# Patient Record
Sex: Female | Born: 1965 | Race: White | Hispanic: No | Smoking: Never smoker
Health system: Southern US, Community
[De-identification: ages and names within clinical notes are randomized; demographics above are authoritative.]

## PROBLEM LIST (undated history)

## (undated) DIAGNOSIS — K8020 Calculus of gallbladder without cholecystitis without obstruction: Secondary | ICD-10-CM

## (undated) DIAGNOSIS — M1990 Unspecified osteoarthritis, unspecified site: Secondary | ICD-10-CM

## (undated) DIAGNOSIS — I493 Ventricular premature depolarization: Secondary | ICD-10-CM

## (undated) DIAGNOSIS — O009 Unspecified ectopic pregnancy without intrauterine pregnancy: Secondary | ICD-10-CM

## (undated) DIAGNOSIS — G7000 Myasthenia gravis without (acute) exacerbation: Secondary | ICD-10-CM

## (undated) HISTORY — PX: OOPHORECTOMY: SHX86

## (undated) HISTORY — PX: TUBAL LIGATION: SHX77

---

## 2000-03-04 ENCOUNTER — Encounter: Payer: Self-pay | Admitting: Emergency Medicine

## 2000-03-04 ENCOUNTER — Emergency Department (HOSPITAL_COMMUNITY): Admission: EM | Admit: 2000-03-04 | Discharge: 2000-03-04 | Payer: Self-pay | Admitting: Emergency Medicine

## 2000-03-10 ENCOUNTER — Encounter: Admission: RE | Admit: 2000-03-10 | Discharge: 2000-05-20 | Payer: Self-pay | Admitting: Internal Medicine

## 2000-04-28 ENCOUNTER — Other Ambulatory Visit: Admission: RE | Admit: 2000-04-28 | Discharge: 2000-04-28 | Payer: Self-pay | Admitting: Internal Medicine

## 2001-04-19 ENCOUNTER — Encounter (INDEPENDENT_AMBULATORY_CARE_PROVIDER_SITE_OTHER): Payer: Self-pay

## 2001-04-19 ENCOUNTER — Ambulatory Visit (HOSPITAL_COMMUNITY): Admission: RE | Admit: 2001-04-19 | Discharge: 2001-04-19 | Payer: Self-pay

## 2001-08-15 ENCOUNTER — Other Ambulatory Visit: Admission: RE | Admit: 2001-08-15 | Discharge: 2001-08-15 | Payer: Self-pay | Admitting: Internal Medicine

## 2004-10-03 ENCOUNTER — Emergency Department (HOSPITAL_COMMUNITY): Admission: EM | Admit: 2004-10-03 | Discharge: 2004-10-03 | Payer: Self-pay | Admitting: Emergency Medicine

## 2006-03-12 ENCOUNTER — Emergency Department (HOSPITAL_COMMUNITY): Admission: EM | Admit: 2006-03-12 | Discharge: 2006-03-12 | Payer: Self-pay | Admitting: Emergency Medicine

## 2006-06-30 ENCOUNTER — Encounter: Payer: Self-pay | Admitting: Obstetrics and Gynecology

## 2006-06-30 ENCOUNTER — Ambulatory Visit: Payer: Self-pay | Admitting: Obstetrics and Gynecology

## 2006-06-30 ENCOUNTER — Observation Stay (HOSPITAL_COMMUNITY): Admission: AD | Admit: 2006-06-30 | Discharge: 2006-07-01 | Payer: Self-pay | Admitting: Obstetrics and Gynecology

## 2006-07-15 ENCOUNTER — Ambulatory Visit: Payer: Self-pay | Admitting: Obstetrics and Gynecology

## 2007-08-29 ENCOUNTER — Observation Stay (HOSPITAL_COMMUNITY): Admission: EM | Admit: 2007-08-29 | Discharge: 2007-08-30 | Payer: Self-pay | Admitting: Emergency Medicine

## 2007-08-29 ENCOUNTER — Ambulatory Visit: Payer: Self-pay | Admitting: Cardiology

## 2010-06-10 NOTE — Discharge Summary (Signed)
NAMECLEMIE, GENERAL              ACCOUNT NO.:  [id number]   MEDICAL RECORD NO.:  [id number]          PATIENT TYPE:  OBV   LOCATION:  6531                         FACILITY:  [facility]   PHYSICIAN:  Eduard Clos, MDDATE OF BIRTH:  11/18/1965   DATE OF ADMISSION:  08/29/2007  DATE OF DISCHARGE:  08/30/2007                               DISCHARGE SUMMARY   COURSE IN THE HOSPITAL:  A 45 year old female with no significant past  medical history, presented with chest pain.  The patient was admitted to  telemetry floor.  Serial cardiac enzymes and EKG were done which were  within acceptable limits.  The patient had a stress test done.  As per  laboratory of cardiology, the patient's stress test was low risk.  The  patient's chest x-ray also did not show an acute finding at this time.  The patient's cardiac enzymes were negative.  The patient is  asymptomatic and with low-risk stress test, the patient was discharged  home.   FINAL DIAGNOSIS:  Atypical chest pain.   MEDICATION ON DISCHARGE:  Multivitamins, taken previously.   PLAN:  The patient advised to follow up with her primary care physician  within a week's time.      Eduard Clos, MD  Electronically Signed     ANK/MEDQ  D:  08/30/2007  T:  08/31/2007  Job:  [id number]

## 2010-06-10 NOTE — Op Note (Signed)
Kelli Stephens, Kelli Stephens                 ACCOUNT NO.:  [id number]   MEDICAL RECORD NO.:  [id number]          PATIENT TYPE:  OBV   LOCATION:  9107                          FACILITY:  [facility]   PHYSICIAN:  Phil D. Okey Dupre, M.D.     DATE OF BIRTH:  01/23/1966   DATE OF PROCEDURE:  06/30/2006  DATE OF DISCHARGE:                               OPERATIVE REPORT   PREOPERATIVE DIAGNOSES:  1. Ruptured ectopic pregnancy.  2. Voluntary sterilization.   POSTOPERATIVE DIAGNOSES:  1. Ruptured right tubal pregnancy with pelvic adhesions.  2. Voluntary sterilization.   PROCEDURES:  1. Exploratory laparotomy.  2. Right salpingo-oophorectomy.  3. Clip occlusion of left fallopian tube.   SURGEON:  Javier Glazier. Okey Dupre, MD.   FIRST ASSISTANT:  Paticia Stack, MD.   ESTIMATED BLOOD LOSS:  400 ml.   POSTOPERATIVE CONDITION:  Satisfactory.   ANESTHESIA:  General.   REASON FOR SURGERY:  The patient, a 45 year old, multiparous, white  female, with a last menstrual period not a normal one sometime in mid  April, had an obvious ruptured ectopic pregnancy on ultrasound and was  taken immediately to the operating room for mini laparotomy.  During  discussion with the patient, she indicated desire to have the other tube  occluded as she wanted no more pregnancies at the age of 36.  We had her  sign the proper papers and agreed to do this.  She was taken to the  operating room and the procedure is as follows:   Under satisfactory general anesthesia with the patient in a dorsal semi-  lithotomy position, the perineum, vagina, and abdomen were prepped and  draped in the usual sterile manner.  Bimanual pelvic examination  revealed a very small anterior uterus, freely movable, with a right  adnexal mass.  A Graves speculum was placed in the cervix, and the  anterior lip of the cervix grasped with a single tooth tenaculum, and a  tenaculum with a sound was inserted into the uterus for mobilization of  the uterus and  easier handling during the procedure.  The abdomen was  then entered through a Pfannenstiel incision situated 1.5 cm above the  symphysis pubis and extending for total length of 8 cm.  The abdomen was  entered by layers, and bleeders were controlled with high cautery on  entry.  On entering the peritoneal cavity, there was a large amount of  blood and clots in the cavity, which were aspirated.  The uterus was  pushed up into the operative field, and the right fallopian tube  identified, followed down and found to be completely normal, and a  Filshie clip was used to occlude the tube.  The other tube was bound  down with adhesions in the cul-de-sac, and there was a 3 cm mass  palpable there, which when brought up ruptured during motion and was the  obvious ectopic.  It was adherent to the area of the cornua and with the  ovary, and it was decided that we would remove that ovary with the tube  as it would be difficult to  dissect that away without interfering with  the circulation to the ovary.  Kelly clamps were placed through the meso  beneath the ovary, and the ovary infundibulopelvic ligament was clamped.  The remaining tissue, including the cornual area of the tube was clamped  with a second clamp, and the tissue above the clamps excised so that the  tube, ovary, and ectopic pregnancy were removed in toto.  Free ties plus  a suture ligature was used on each of the clamped pedicles, and the area  was observed for any bleeding; none was noted.  The cul-de-sac was  examined for any sign of bleeding where the adhesions were; none was  noted.  The incision was closed by fascial closure with a #0 Vicryl  suture on an atraumatic needle.  Subcuticular sutures of #3-0 Vicryl  were used for subcuticular closure.  Dermoplasty was used for skin edge  closure.  Tape, instrument, sponge, and needle count reported correct at  this time.  A dry sterile dressing was applied.  The Foley catheter was   draining clear amber urine at the end of the procedure.  The tenaculums  were removed from the vagina, and the patient transferred to the  recovery room in satisfactory condition, having tolerated the procedure  well.      Phil D. Okey Dupre, M.D.  Electronically Signed     PDR/MEDQ  D:  06/30/2006  T:  06/30/2006  Job:  [id number]

## 2010-06-10 NOTE — H&P (Signed)
NAME:  Kelli Stephens, Kelli Stephens NO.:  [id number]   MEDICAL RECORD NO.:  [id number]          PATIENT TYPE:  EMS   LOCATION:  MAJO                         FACILITY:  [facility]   PHYSICIAN:  Vania Rea, M.D. DATE OF BIRTH:  10/30/1965   DATE OF ADMISSION:  08/29/2007  DATE OF DISCHARGE:                              HISTORY & PHYSICAL   PRIMARY CARE PHYSICIAN:  Unassigned.   CHIEF COMPLAINT:  Chest pain since this afternoon.   HISTORY OF PRESENT ILLNESS:  This is a 45 year old Caucasian lady with  no significant past medical history, but who does have strong family  history of hypertension and diabetes, but has been feeling vaguely ill  for about a week now.  She started having moderately severe left upper  chest pressure radiating through to her left scapula since earlier this  afternoon.  The patient presented to the emergency room about an hour  after the symptoms started and received sublingual nitroglycerin which  did bring some relief.  The patient says the chest pressure was  associated with nausea and diaphoresis and lightheadedness, but there  was no frank syncope.  The patient has not experienced similar symptoms.  She did have an episode some years ago of left-sided numbness, tunnel  vision, and syncopal type event associated with slurred speech and was  told it resolved by the time she got to the emergency room and she was  told it was a TIA.   The patient denies shortness of breath, PND, or lower extremity edema.  She denies dyspnea on exertion.   PAST MEDICAL HISTORY:  Significant for fibroid uterus, peripartum  hypertension, miscarriages, and a premature twin pregnancy 8 years ago.   MEDICATIONS:  Multivitamin.   ALLERGIES:  No known drug allergies.   SOCIAL HISTORY:  Denies tobacco, alcohol, or illicit drug use.  She does  clerical work for counseling.   FAMILY HISTORY:  Significant for a father who was diabetic and  hypertensive and a mother who  was hypertensive.  She has a sister with  Crohn's and some type of cardiac disease.  She says she has a brother  with down syndrome.  Both her mother and her maternal grandmother had  cancer of the uterus.   REVIEW OF SYSTEMS:  Other than noted above, significant only for  irregular menstruation.  She denies any history of anxiety or any type  of psychiatric history.  The patient has irregular menstruation.  Her  last menstrual period started about 3 days ago and it is currently  ending and this is typical for her.   PHYSICAL EXAMINATION:  GENERAL:  Pleasant, young, Caucasian lady  reclining on a stretcher in no acute distress.  VITAL SIGNS:  Temperature is 98.2, pulse 66, respirations 18, blood  pressure 105/61, and she is saturating at 100% on room air.  Her pain  level is 7/10 on admission and it is currently 0/10.  However, she does  continue to complain of some chest pressure and discomfort.  HEENT:  Pupils are round, equal, and reactive.  Mucous membranes are  pink and anicteric.  She  has no cervical lymphadenopathy, no  thyromegaly, and no carotid bruit.  Her teeth are in good condition.  She has no caries.  CHEST:  Clear to auscultation bilaterally.  Her chest pressure is not  reproducible.  CARDIOVASCULAR:  Regular rhythm without murmur.  ABDOMEN:  Scaphoid, soft, and nontender.  EXTREMITIES:  Without edema.  She has 2+ pulses bilaterally.  NEUROLOGY:  Cranial nerves II-XII grossly intact.  She has no focal  neurologic deficit.  She does describe mild tenderness over the lower  cervical and upper thoracic vertebrae, but not along either trapezius  muscle.  She has no tenderness of the posterior chest wall.   LABORATORY DATA:  CBC is completely unremarkable.  Her serum chemistry  is likewise unremarkable.  Her cardiac enzymes have been completely  normal with a myoglobin of 37.8, undetectable CK-MB and troponin.   ASSESSMENT:  A 45 year old Caucasian lady who presents with  chest  pressure associated with diaphoresis relieved by nitroglycerin, but with  negative cardiac enzymes and abnormal EKG.   PLAN:  We will bring this lady in on a chest pain rule out and hopefully  in the morning she can get a cardiac stress test.  If this is negative,  she can be followed up by her primary care physician.      Vania Rea, M.D.  Electronically Signed     LC/MEDQ  D:  08/29/2007  T:  08/30/2007  Job:  [id number]

## 2010-06-13 NOTE — Op Note (Signed)
[facility]  Patient:    Kelli Stephens, Kelli Stephens Visit Number: [id number] MRN: [id number]          Service Type: DSU Location: [facility] Attending Physician:  Barbaraann Cao Dictated by:   Ronda Fairly. Galen Daft, M.D. Proc. Date: 04/19/01 Admit Date:  04/19/2001   CC:         Crista Luria, M.D.   Operative Report  PREOPERATIVE DIAGNOSIS:       Missed abortion.  POSTOPERATIVE DIAGNOSIS:      Missed abortion.  OPERATION:                    Suction dilatation and evacuation.  SURGEON:                      Ronda Fairly. Galen Daft, M.D.  ANESTHESIA:                   IV sedation and MAC.  COMPLICATIONS:                None.  ESTIMATED BLOOD LOSS:         Minimal.  SPECIMEN:                     Uterine contents.  DESCRIPTION OF PROCEDURE:     The patient was identified as Kelli Stephens. Informed consent had been obtained regarding the risks of the procedure, the risks of incomplete procedure, and the requirement of a secondary procedure, as well as the risks of this situation with infection and bleeding.  The postoperative instructions were given to the patient prior to the anesthesia.  The patient had signed the consent, and we brought her back to the operating room.  The patient had an uterus eight to ten weeks size and mobile.  The adnexal areas were nontender and not enlarged.  The cervix was infiltrated with 1% lidocaine, a total of 8 cc for cervical block.  The cervix was dilated to accept a 10 mm suction curet and this was placed into the fundus, and suction was applied, removing products of conception.  It was complete.  The sharp curet was utilized for diagnostic purposes at the end, and there was a sharp uterine cry at all quadrants, suggestive of complete procedure. However, there is some retained tissue.  The patient was not bleeding actively at the end of the procedure.  She tolerated it well and left the operating room in stable condition. Dictated by:    Ronda Fairly. Galen Daft, M.D. Attending Physician:  Barbaraann Cao DD:  04/19/01 TD:  04/20/01 Job: [id number]

## 2010-07-26 ENCOUNTER — Emergency Department (HOSPITAL_COMMUNITY)
Admission: EM | Admit: 2010-07-26 | Discharge: 2010-07-26 | Disposition: A | Discharge status: Home / Self Care | Payer: 59 | Attending: Emergency Medicine | Admitting: Emergency Medicine

## 2010-07-26 ENCOUNTER — Emergency Department (HOSPITAL_COMMUNITY): Payer: 59

## 2010-07-26 DIAGNOSIS — R109 Unspecified abdominal pain: Secondary | ICD-10-CM | POA: Insufficient documentation

## 2010-07-26 DIAGNOSIS — R112 Nausea with vomiting, unspecified: Secondary | ICD-10-CM | POA: Insufficient documentation

## 2010-07-26 DIAGNOSIS — K8020 Calculus of gallbladder without cholecystitis without obstruction: Secondary | ICD-10-CM | POA: Insufficient documentation

## 2010-07-26 DIAGNOSIS — R630 Anorexia: Secondary | ICD-10-CM | POA: Insufficient documentation

## 2010-07-26 DIAGNOSIS — K5900 Constipation, unspecified: Secondary | ICD-10-CM | POA: Insufficient documentation

## 2010-07-26 LAB — COMPREHENSIVE METABOLIC PANEL
ALT: 18 U/L (ref 0–35)
AST: 14 U/L (ref 0–37)
Alkaline Phosphatase: 70 U/L (ref 39–117)
CO2: 28 mEq/L (ref 19–32)
Calcium: 9.1 mg/dL (ref 8.4–10.5)
GFR calc Af Amer: >60 mL/min (ref >60)
GFR calc non Af Amer: >60 mL/min (ref >60)
Glucose, Bld: 104 mg/dL — ABNORMAL HIGH (ref 70–99)
Potassium: 3.6 mEq/L (ref 3.5–5.1)
Sodium: 137 mEq/L (ref 135–145)

## 2010-07-26 LAB — URINE MICROSCOPIC-ADD ON

## 2010-07-26 LAB — DIFFERENTIAL
Basophils Absolute: 0 10*3/uL (ref 0.0–0.1)
Basophils Relative: 0 % (ref 0–1)
Eosinophils Absolute: 0.2 10*3/uL (ref 0.0–0.7)
Monocytes Relative: 8 % (ref 3–12)
Neutro Abs: 5 10*3/uL (ref 1.7–7.7)
Neutrophils Relative %: 61 % (ref 43–77)

## 2010-07-26 LAB — CBC
Hemoglobin: 13.3 g/dL (ref 12.0–15.0)
Platelets: 242 10*3/uL (ref 150–400)
RBC: 4.09 MIL/uL (ref 3.87–5.11)
WBC: 8.2 10*3/uL (ref 4.0–10.5)

## 2010-07-26 LAB — URINALYSIS, ROUTINE W REFLEX MICROSCOPIC
Bilirubin Urine: NEGATIVE
Glucose, UA: NEGATIVE mg/dL
Specific Gravity, Urine: 1.022 (ref 1.005–1.030)
Urobilinogen, UA: 0.2 mg/dL (ref 0.0–1.0)

## 2010-07-29 ENCOUNTER — Encounter (INDEPENDENT_AMBULATORY_CARE_PROVIDER_SITE_OTHER): Payer: Self-pay | Admitting: General Surgery

## 2010-10-01 ENCOUNTER — Other Ambulatory Visit: Payer: Self-pay | Admitting: Family Medicine

## 2010-10-01 DIAGNOSIS — R51 Headache: Secondary | ICD-10-CM

## 2010-10-03 ENCOUNTER — Emergency Department (HOSPITAL_COMMUNITY): Payer: 59

## 2010-10-03 ENCOUNTER — Emergency Department (HOSPITAL_COMMUNITY)
Admission: EM | Admit: 2010-10-03 | Discharge: 2010-10-03 | Disposition: A | Discharge status: Home / Self Care | Payer: 59 | Attending: Emergency Medicine | Admitting: Emergency Medicine

## 2010-10-03 DIAGNOSIS — R51 Headache: Secondary | ICD-10-CM | POA: Insufficient documentation

## 2010-10-03 DIAGNOSIS — R29810 Facial weakness: Secondary | ICD-10-CM | POA: Insufficient documentation

## 2010-10-03 DIAGNOSIS — H02409 Unspecified ptosis of unspecified eyelid: Secondary | ICD-10-CM | POA: Insufficient documentation

## 2010-10-03 DIAGNOSIS — H538 Other visual disturbances: Secondary | ICD-10-CM | POA: Insufficient documentation

## 2010-10-03 LAB — DIFFERENTIAL
Basophils Absolute: 0 10*3/uL (ref 0.0–0.1)
Basophils Relative: 0 % (ref 0–1)
Eosinophils Relative: 1 % (ref 0–5)
Lymphocytes Relative: 31 % (ref 12–46)
Monocytes Absolute: 0.6 10*3/uL (ref 0.1–1.0)
Monocytes Relative: 8 % (ref 3–12)
Neutro Abs: 4.1 10*3/uL (ref 1.7–7.7)

## 2010-10-03 LAB — CBC
HCT: 40 % (ref 36.0–46.0)
Hemoglobin: 13.6 g/dL (ref 12.0–15.0)
MCH: 31.4 pg (ref 26.0–34.0)
MCHC: 34 g/dL (ref 30.0–36.0)
RDW: 12.7 % (ref 11.5–15.5)

## 2010-10-03 LAB — COMPREHENSIVE METABOLIC PANEL
Alkaline Phosphatase: 65 U/L (ref 39–117)
BUN: 11 mg/dL (ref 6–23)
Chloride: 104 mEq/L (ref 96–112)
Creatinine, Ser: 0.63 mg/dL (ref 0.50–1.10)
GFR calc Af Amer: >60 mL/min (ref >60)
GFR calc non Af Amer: >60 mL/min (ref >60)
Glucose, Bld: 82 mg/dL (ref 70–99)
Potassium: 3.9 mEq/L (ref 3.5–5.1)
Total Bilirubin: 0.5 mg/dL (ref 0.3–1.2)

## 2010-10-03 LAB — CK TOTAL AND CKMB (NOT AT ARMC)
CK, MB: 1.7 ng/mL (ref 0.3–4.0)
Total CK: 65 U/L (ref 7–177)

## 2010-10-03 IMAGING — CT CT HEAD W/O CM
2 series · 16 of 30 positions shown, 20 images · non-contrast
Comparison: Brain MR dated [DATE].

CLINICAL DATA: Right-sided headache.  Weakness.  Right eyelid
weakness.

CT HEAD WITHOUT CONTRAST
TECHNIQUE: Contiguous axial images were obtained from the base of
the skull through the vertex without contrast.

[Series 2: head w/o · axial · non-contrast · 0.49mm/px · z∈[+99,+229]mm · 13 of 32 slices shown, 17 images]
[im 3/32  brain]
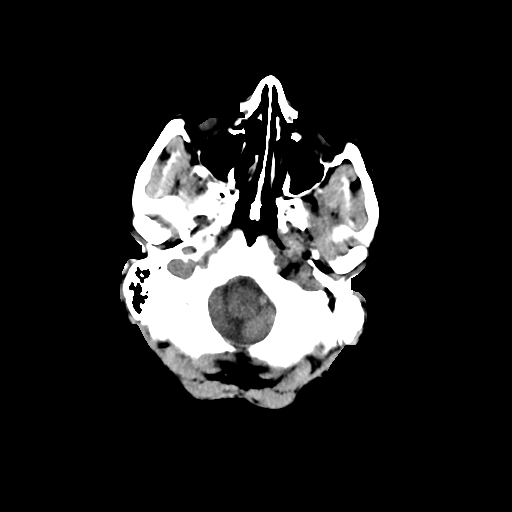
[im 3/32  bone]
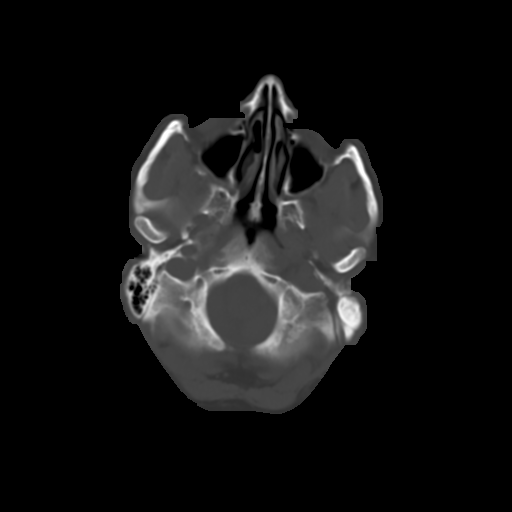
[im 5/32  brain]
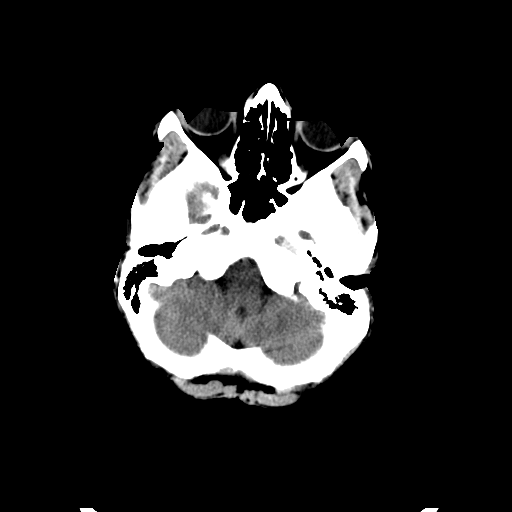
[im 7/32  brain]
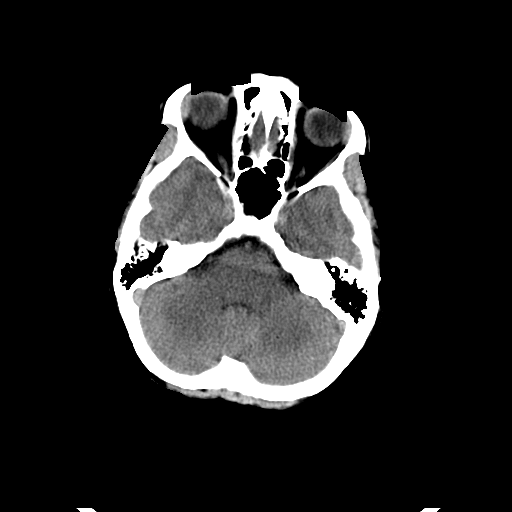
[im 9/32  brain]
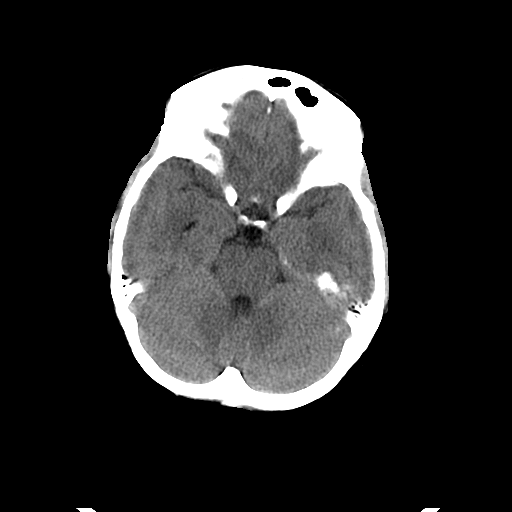
[im 12/32  brain]
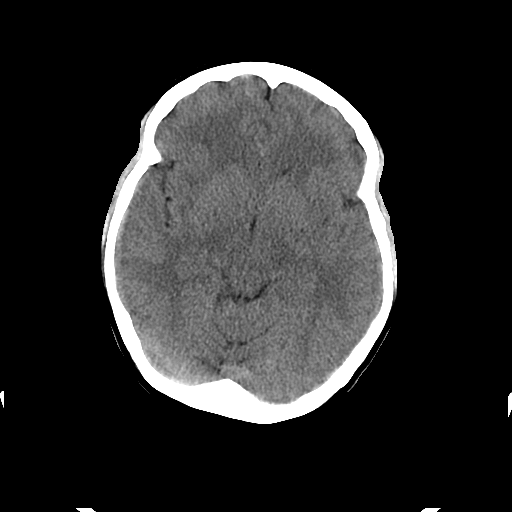
[im 12/32  bone]
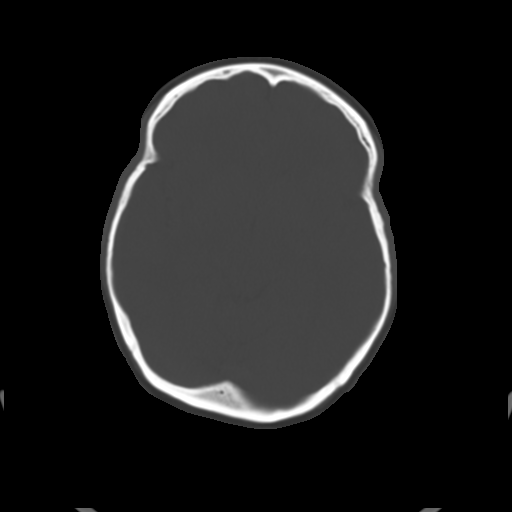
[im 14/32  brain]
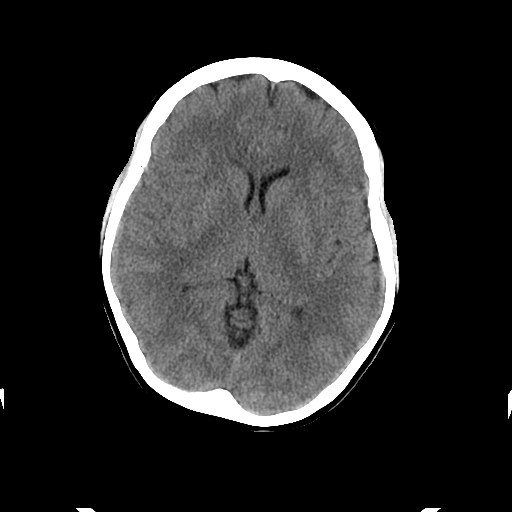
[im 16/32  brain]
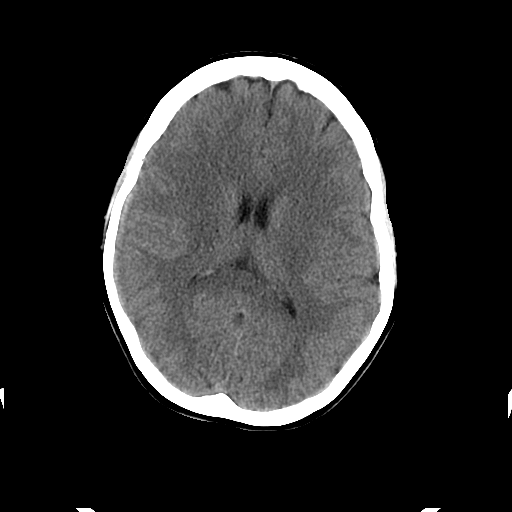
[im 18/32  brain]
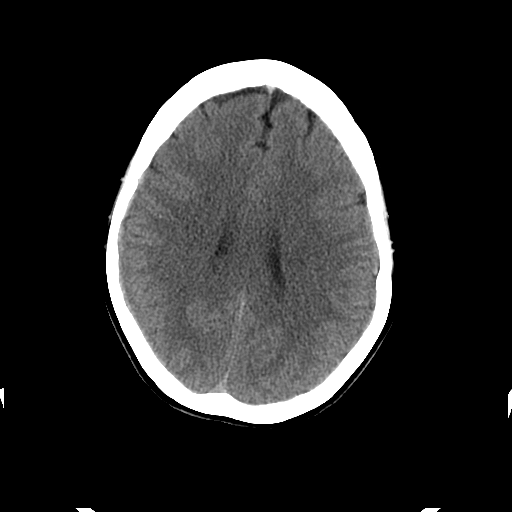
[im 20/32  brain]
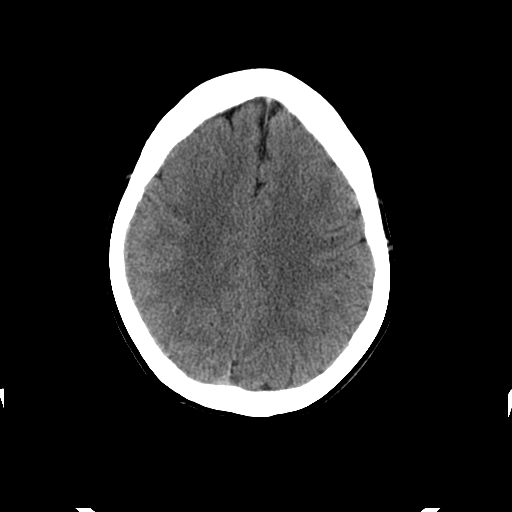
[im 20/32  bone]
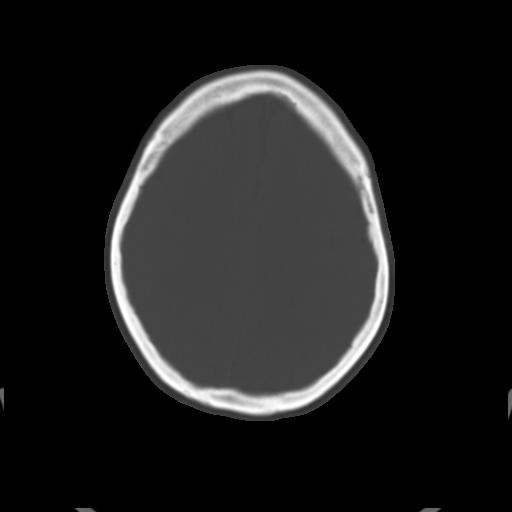
[im 23/32  brain]
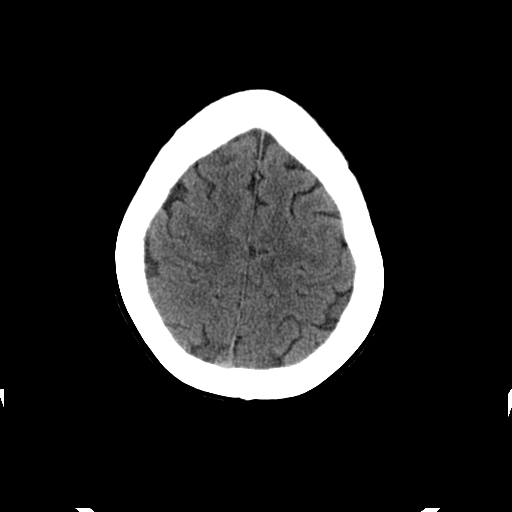
[im 25/32  brain]
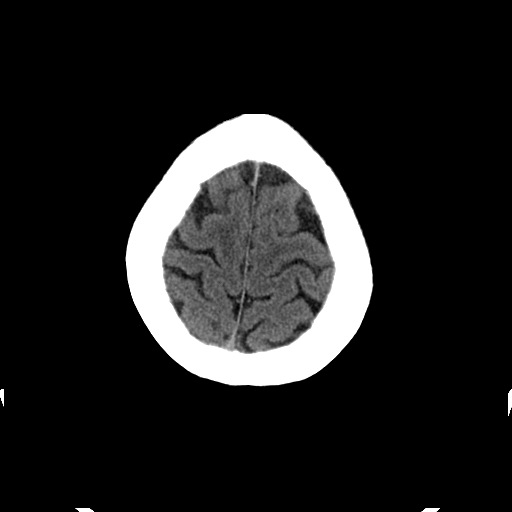
[im 27/32  brain]
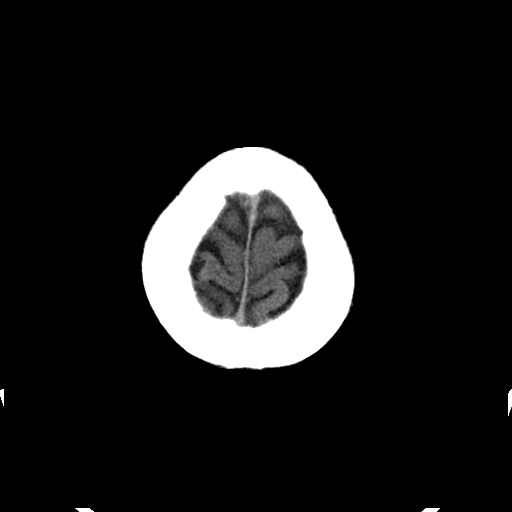
[im 29/32  brain]
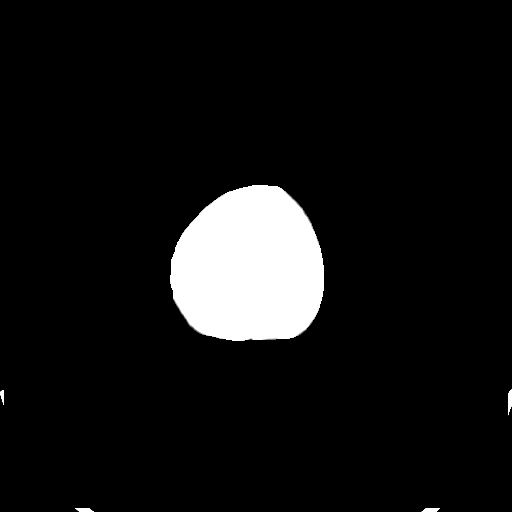
[im 29/32  bone]
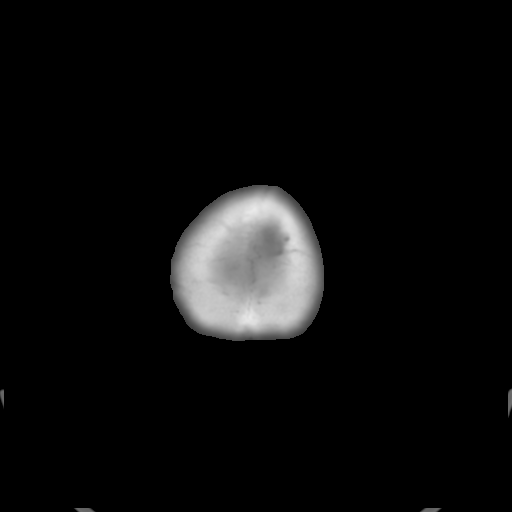

[Series 3: head w/o bone · axial · non-contrast · 0.49mm/px · z∈[+99,+144]mm · 3 of 32 slices shown]
[im 3/32  bone]
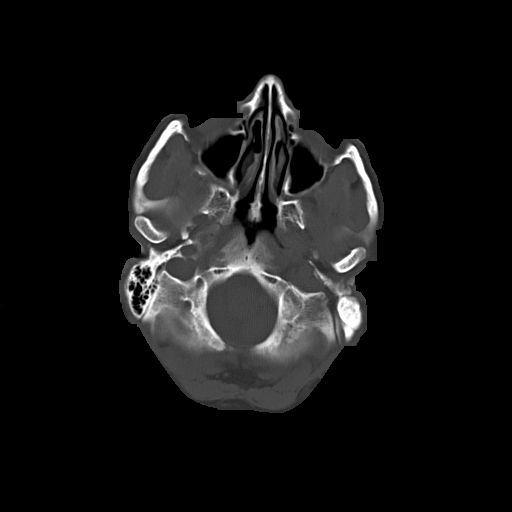
[im 7/32  bone]
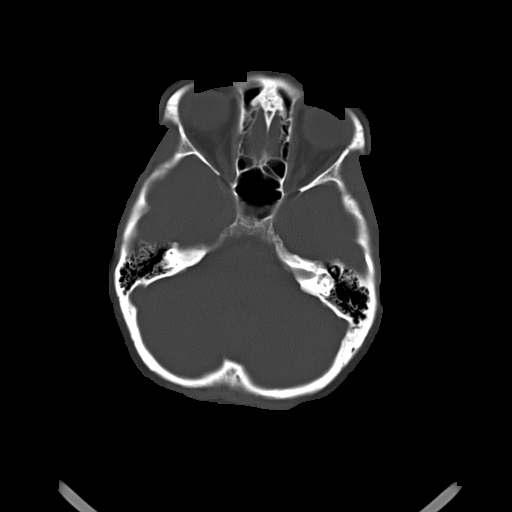
[im 12/32  bone]
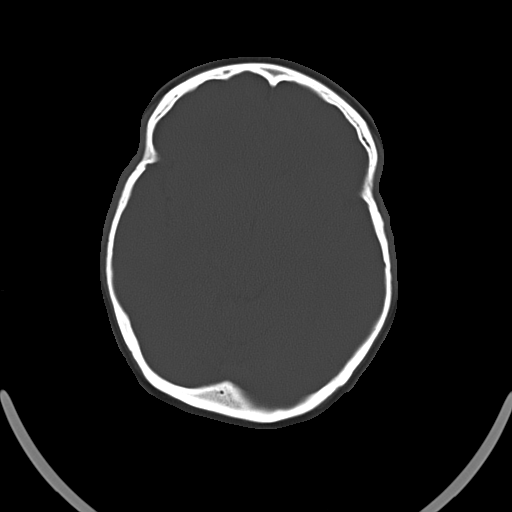

[16 of 30 positions shown; findings below may reference images not displayed]

FINDINGS: Normal appearing cerebral hemispheres and posterior fossa
structures.  Normal size and position of the ventricles.  No
intracranial hemorrhage, mass lesion or evidence of acute
infarction.  Unremarkable bones and included portions of the
paranasal sinuses.
IMPRESSION: Normal examination.

## 2010-10-04 ENCOUNTER — Ambulatory Visit
Admission: RE | Admit: 2010-10-04 | Discharge: 2010-10-04 | Disposition: A | Discharge status: Home / Self Care | Payer: 59 | Source: Ambulatory Visit | Attending: Family Medicine | Admitting: Family Medicine

## 2010-10-04 DIAGNOSIS — R51 Headache: Secondary | ICD-10-CM

## 2010-10-10 ENCOUNTER — Other Ambulatory Visit: Payer: Self-pay | Admitting: Neurology

## 2010-10-10 DIAGNOSIS — H02409 Unspecified ptosis of unspecified eyelid: Secondary | ICD-10-CM

## 2010-10-16 ENCOUNTER — Ambulatory Visit
Admission: RE | Admit: 2010-10-16 | Discharge: 2010-10-16 | Disposition: A | Discharge status: Home / Self Care | Payer: 59 | Source: Ambulatory Visit | Attending: Neurology | Admitting: Neurology

## 2010-10-16 DIAGNOSIS — H02409 Unspecified ptosis of unspecified eyelid: Secondary | ICD-10-CM

## 2010-10-23 ENCOUNTER — Emergency Department (HOSPITAL_COMMUNITY): Payer: 59

## 2010-10-23 ENCOUNTER — Emergency Department (HOSPITAL_COMMUNITY)
Admission: EM | Admit: 2010-10-23 | Discharge: 2010-10-23 | Disposition: A | Discharge status: Home / Self Care | Payer: 59 | Attending: Emergency Medicine | Admitting: Emergency Medicine

## 2010-10-23 DIAGNOSIS — R42 Dizziness and giddiness: Secondary | ICD-10-CM | POA: Insufficient documentation

## 2010-10-23 DIAGNOSIS — R079 Chest pain, unspecified: Secondary | ICD-10-CM | POA: Insufficient documentation

## 2010-10-23 LAB — COMPREHENSIVE METABOLIC PANEL
ALT: 15 U/L (ref 0–35)
Alkaline Phosphatase: 63 U/L (ref 39–117)
CO2: 27 mEq/L (ref 19–32)
GFR calc Af Amer: >60 mL/min (ref >60)
Glucose, Bld: 95 mg/dL (ref 70–99)
Potassium: 3.9 mEq/L (ref 3.5–5.1)
Sodium: 140 mEq/L (ref 135–145)
Total Protein: 7.3 g/dL (ref 6.0–8.3)

## 2010-10-23 LAB — DIFFERENTIAL
Basophils Absolute: 0 10*3/uL (ref 0.0–0.1)
Basophils Relative: 0 % (ref 0–1)
Neutro Abs: 4.5 10*3/uL (ref 1.7–7.7)
Neutrophils Relative %: 60 % (ref 43–77)

## 2010-10-23 LAB — POCT I-STAT TROPONIN I: Troponin i, poc: 0.04 ng/mL (ref 0.00–0.08)

## 2010-10-23 LAB — POCT I-STAT, CHEM 8
Chloride: 105 mEq/L (ref 96–112)
HCT: 42 % (ref 36.0–46.0)
Potassium: 3.2 mEq/L — ABNORMAL LOW (ref 3.5–5.1)

## 2010-10-23 LAB — CBC
Hemoglobin: 13.2 g/dL (ref 12.0–15.0)
MCHC: 33.8 g/dL (ref 30.0–36.0)
RBC: 4.19 MIL/uL (ref 3.87–5.11)
WBC: 7.5 10*3/uL (ref 4.0–10.5)

## 2010-10-24 LAB — POCT I-STAT, CHEM 8
BUN: 12
Calcium, Ion: 1.22
Creatinine, Ser: 0.9
Glucose, Bld: 91
TCO2: 25

## 2010-10-24 LAB — LIPID PANEL
LDL Cholesterol: 103 — ABNORMAL HIGH
Triglycerides: 35

## 2010-10-24 LAB — POCT CARDIAC MARKERS
CKMB, poc: <1 — ABNORMAL LOW
CKMB, poc: <1 — ABNORMAL LOW
Troponin i, poc: <0.05
Troponin i, poc: <0.05

## 2010-10-24 LAB — DIFFERENTIAL
Eosinophils Absolute: 0
Eosinophils Relative: 1
Lymphs Abs: 1.6
Monocytes Relative: 7

## 2010-10-24 LAB — CBC
HCT: 41.8
MCV: 96.6
Platelets: 214
WBC: 5.8

## 2010-10-24 LAB — CARDIAC PANEL(CRET KIN+CKTOT+MB+TROPI)
CK, MB: 0.5
Total CK: 51

## 2010-10-24 LAB — D-DIMER, QUANTITATIVE: D-Dimer, Quant: 0.24

## 2010-10-27 ENCOUNTER — Emergency Department (HOSPITAL_COMMUNITY): Payer: 59

## 2010-10-27 ENCOUNTER — Emergency Department (HOSPITAL_COMMUNITY)
Admission: EM | Admit: 2010-10-27 | Discharge: 2010-10-27 | Disposition: A | Discharge status: Home / Self Care | Payer: 59 | Attending: Emergency Medicine | Admitting: Emergency Medicine

## 2010-10-27 DIAGNOSIS — I4891 Unspecified atrial fibrillation: Secondary | ICD-10-CM | POA: Insufficient documentation

## 2010-10-27 DIAGNOSIS — R42 Dizziness and giddiness: Secondary | ICD-10-CM | POA: Insufficient documentation

## 2010-10-27 DIAGNOSIS — R002 Palpitations: Secondary | ICD-10-CM | POA: Insufficient documentation

## 2010-10-27 DIAGNOSIS — R059 Cough, unspecified: Secondary | ICD-10-CM | POA: Insufficient documentation

## 2010-10-27 DIAGNOSIS — R05 Cough: Secondary | ICD-10-CM | POA: Insufficient documentation

## 2010-10-27 DIAGNOSIS — G7000 Myasthenia gravis without (acute) exacerbation: Secondary | ICD-10-CM | POA: Insufficient documentation

## 2010-10-27 LAB — DIFFERENTIAL
Eosinophils Absolute: 0 10*3/uL (ref 0.0–0.7)
Eosinophils Relative: 1 % (ref 0–5)
Lymphocytes Relative: 28 % (ref 12–46)
Lymphs Abs: 1.9 10*3/uL (ref 0.7–4.0)
Monocytes Relative: 7 % (ref 3–12)
Neutrophils Relative %: 65 % (ref 43–77)

## 2010-10-27 LAB — COMPREHENSIVE METABOLIC PANEL
ALT: 14 U/L (ref 0–35)
Alkaline Phosphatase: 63 U/L (ref 39–117)
BUN: 7 mg/dL (ref 6–23)
CO2: 25 mEq/L (ref 19–32)
Chloride: 103 mEq/L (ref 96–112)
GFR calc Af Amer: >90 mL/min (ref >90)
GFR calc non Af Amer: >90 mL/min (ref >90)
Glucose, Bld: 87 mg/dL (ref 70–99)
Potassium: 3.9 mEq/L (ref 3.5–5.1)
Sodium: 138 mEq/L (ref 135–145)
Total Bilirubin: 0.4 mg/dL (ref 0.3–1.2)
Total Protein: 7.2 g/dL (ref 6.0–8.3)

## 2010-10-27 LAB — URINALYSIS, ROUTINE W REFLEX MICROSCOPIC
Bilirubin Urine: NEGATIVE
Glucose, UA: NEGATIVE mg/dL
Hgb urine dipstick: NEGATIVE
Specific Gravity, Urine: 1.007 (ref 1.005–1.030)
Urobilinogen, UA: 0.2 mg/dL (ref 0.0–1.0)

## 2010-10-27 LAB — CBC
HCT: 38.6 % (ref 36.0–46.0)
MCH: 32 pg (ref 26.0–34.0)
MCV: 92.8 fL (ref 78.0–100.0)
Platelets: 241 10*3/uL (ref 150–400)
RBC: 4.16 MIL/uL (ref 3.87–5.11)

## 2010-10-29 LAB — URINE CULTURE: Culture  Setup Time: 201210020206

## 2010-11-13 LAB — CBC
HCT: 32.4 — ABNORMAL LOW
HCT: 37.5
Hemoglobin: 10.5 — ABNORMAL LOW
Hemoglobin: 11.1 — ABNORMAL LOW
Hemoglobin: 12.8
MCHC: 34.1
MCHC: 34.2
MCHC: 34.3
RDW: 13
RDW: 13.4
RDW: 13.4

## 2010-11-13 LAB — DIFFERENTIAL
Basophils Absolute: 0
Basophils Relative: 0
Monocytes Absolute: 0.7
Neutro Abs: 9.4 — ABNORMAL HIGH

## 2010-11-13 LAB — CROSSMATCH

## 2010-12-10 ENCOUNTER — Other Ambulatory Visit: Payer: Self-pay | Admitting: Family Medicine

## 2010-12-10 ENCOUNTER — Other Ambulatory Visit (HOSPITAL_COMMUNITY)
Admission: RE | Admit: 2010-12-10 | Discharge: 2010-12-10 | Disposition: A | Discharge status: Home / Self Care | Payer: 59 | Source: Ambulatory Visit | Attending: Family Medicine | Admitting: Family Medicine

## 2010-12-10 DIAGNOSIS — Z0000 Encounter for general adult medical examination without abnormal findings: Secondary | ICD-10-CM | POA: Insufficient documentation

## 2012-03-22 ENCOUNTER — Observation Stay (HOSPITAL_COMMUNITY): Payer: 59

## 2012-03-22 ENCOUNTER — Observation Stay (HOSPITAL_COMMUNITY): Payer: 59 | Admitting: Anesthesiology

## 2012-03-22 ENCOUNTER — Encounter (HOSPITAL_COMMUNITY): Payer: Self-pay

## 2012-03-22 ENCOUNTER — Emergency Department (HOSPITAL_COMMUNITY): Payer: 59

## 2012-03-22 ENCOUNTER — Observation Stay (HOSPITAL_COMMUNITY)
Admission: EM | Admit: 2012-03-22 | Discharge: 2012-03-24 | Disposition: A | Discharge status: Home / Self Care | Payer: 59 | Attending: General Surgery | Admitting: General Surgery

## 2012-03-22 ENCOUNTER — Encounter (HOSPITAL_COMMUNITY)
Admission: EM | Disposition: A | Discharge status: Home / Self Care | Payer: Self-pay | Source: Home / Self Care | Attending: Emergency Medicine

## 2012-03-22 ENCOUNTER — Encounter (HOSPITAL_COMMUNITY): Payer: Self-pay | Admitting: Anesthesiology

## 2012-03-22 DIAGNOSIS — K8020 Calculus of gallbladder without cholecystitis without obstruction: Principal | ICD-10-CM | POA: Diagnosis present

## 2012-03-22 DIAGNOSIS — G7000 Myasthenia gravis without (acute) exacerbation: Secondary | ICD-10-CM | POA: Insufficient documentation

## 2012-03-22 DIAGNOSIS — K8010 Calculus of gallbladder with chronic cholecystitis without obstruction: Secondary | ICD-10-CM

## 2012-03-22 DIAGNOSIS — K824 Cholesterolosis of gallbladder: Secondary | ICD-10-CM

## 2012-03-22 HISTORY — DX: Calculus of gallbladder without cholecystitis without obstruction: K80.20

## 2012-03-22 HISTORY — DX: Ventricular premature depolarization: I49.3

## 2012-03-22 HISTORY — PX: CHOLECYSTECTOMY: SHX55

## 2012-03-22 HISTORY — DX: Unspecified ectopic pregnancy without intrauterine pregnancy: O00.90

## 2012-03-22 HISTORY — DX: Myasthenia gravis without (acute) exacerbation: G70.00

## 2012-03-22 LAB — CBC WITH DIFFERENTIAL/PLATELET
Basophils Absolute: 0 10*3/uL (ref 0.0–0.1)
Basophils Relative: 0 % (ref 0–1)
Hemoglobin: 12.8 g/dL (ref 12.0–15.0)
MCHC: 33.9 g/dL (ref 30.0–36.0)
Monocytes Relative: 6 % (ref 3–12)
Neutro Abs: 4.1 10*3/uL (ref 1.7–7.7)
Neutrophils Relative %: 49 % (ref 43–77)
Platelets: 241 10*3/uL (ref 150–400)
RDW: 13.3 % (ref 11.5–15.5)

## 2012-03-22 LAB — COMPREHENSIVE METABOLIC PANEL
ALT: 14 U/L (ref 0–35)
AST: 17 U/L (ref 0–37)
Albumin: 3.5 g/dL (ref 3.5–5.2)
Alkaline Phosphatase: 72 U/L (ref 39–117)
Chloride: 105 mEq/L (ref 96–112)
Potassium: 3.7 mEq/L (ref 3.5–5.1)
Sodium: 143 mEq/L (ref 135–145)
Total Bilirubin: 0.2 mg/dL — ABNORMAL LOW (ref 0.3–1.2)

## 2012-03-22 LAB — POCT PREGNANCY, URINE: Preg Test, Ur: NEGATIVE

## 2012-03-22 LAB — URINALYSIS, MICROSCOPIC ONLY
Bilirubin Urine: NEGATIVE
Glucose, UA: NEGATIVE mg/dL
Ketones, ur: NEGATIVE mg/dL
Specific Gravity, Urine: 1.026 (ref 1.005–1.030)
pH: 5.5 (ref 5.0–8.0)

## 2012-03-22 LAB — SURGICAL PCR SCREEN
MRSA, PCR: NEGATIVE
Staphylococcus aureus: NEGATIVE

## 2012-03-22 LAB — POCT I-STAT TROPONIN I: Troponin i, poc: 0 ng/mL (ref 0.00–0.08)

## 2012-03-22 SURGERY — LAPAROSCOPIC CHOLECYSTECTOMY
Anesthesia: General | Site: Abdomen | Wound class: Clean Contaminated

## 2012-03-22 MED ORDER — DIPHENHYDRAMINE HCL 50 MG/ML IJ SOLN: 12.5000 mg | Freq: Four times a day (QID) | INTRAMUSCULAR | Status: DC | PRN

## 2012-03-22 MED ORDER — PROMETHAZINE HCL 25 MG/ML IJ SOLN: 6.2500 mg | INTRAMUSCULAR | Status: DC | PRN

## 2012-03-22 MED ORDER — ACETAMINOPHEN 650 MG RE SUPP: 650.0000 mg | Freq: Four times a day (QID) | RECTAL | Status: DC | PRN

## 2012-03-22 MED ORDER — ACETAMINOPHEN 325 MG PO TABS
650.0000 mg | ORAL_TABLET | Freq: Four times a day (QID) | ORAL | Status: DC | PRN
  Administered 2012-03-23 (×2): 650 mg via ORAL
  Filled 2012-03-22 (×2): qty 2

## 2012-03-22 MED ORDER — FENTANYL CITRATE 0.05 MG/ML IJ SOLN
INTRAMUSCULAR | Status: DC | PRN
  Administered 2012-03-22: 50 ug via INTRAVENOUS
  Administered 2012-03-22: 200 ug via INTRAVENOUS

## 2012-03-22 MED ORDER — EPHEDRINE SULFATE 50 MG/ML IJ SOLN
INTRAMUSCULAR | Status: DC | PRN
  Administered 2012-03-22: 5 mg via INTRAVENOUS

## 2012-03-22 MED ORDER — LIDOCAINE HCL (CARDIAC) 20 MG/ML IV SOLN
INTRAVENOUS | Status: DC | PRN
  Administered 2012-03-22: 30 mg via INTRAVENOUS

## 2012-03-22 MED ORDER — HYDROMORPHONE HCL PF 1 MG/ML IJ SOLN
INTRAMUSCULAR | Status: AC
  Filled 2012-03-22: qty 1

## 2012-03-22 MED ORDER — BENZONATATE 100 MG PO CAPS
100.0000 mg | ORAL_CAPSULE | Freq: Three times a day (TID) | ORAL | Status: DC
  Administered 2012-03-22 – 2012-03-24 (×5): 100 mg via ORAL
  Filled 2012-03-22 (×9): qty 1

## 2012-03-22 MED ORDER — DIPHENHYDRAMINE HCL 12.5 MG/5ML PO ELIX
12.5000 mg | ORAL_SOLUTION | Freq: Four times a day (QID) | ORAL | Status: DC | PRN
  Filled 2012-03-22: qty 10

## 2012-03-22 MED ORDER — LIDOCAINE HCL 4 % MT SOLN
OROMUCOSAL | Status: DC | PRN
  Administered 2012-03-22: 4 mL via TOPICAL

## 2012-03-22 MED ORDER — MIDAZOLAM HCL 5 MG/5ML IJ SOLN
INTRAMUSCULAR | Status: DC | PRN
  Administered 2012-03-22: 2 mg via INTRAVENOUS

## 2012-03-22 MED ORDER — OXYCODONE-ACETAMINOPHEN 5-325 MG PO TABS: 1.0000 | ORAL_TABLET | ORAL | Status: DC | PRN

## 2012-03-22 MED ORDER — ONDANSETRON HCL 4 MG/2ML IJ SOLN
INTRAMUSCULAR | Status: DC | PRN
  Administered 2012-03-22: 4 mg via INTRAVENOUS

## 2012-03-22 MED ORDER — KCL IN DEXTROSE-NACL 20-5-0.45 MEQ/L-%-% IV SOLN
INTRAVENOUS | Status: DC
  Administered 2012-03-22: 22:00:00 via INTRAVENOUS
  Filled 2012-03-22 (×4): qty 1000

## 2012-03-22 MED ORDER — ONDANSETRON HCL 4 MG/2ML IJ SOLN: 4.0000 mg | Freq: Four times a day (QID) | INTRAMUSCULAR | Status: DC | PRN

## 2012-03-22 MED ORDER — KCL IN DEXTROSE-NACL 20-5-0.45 MEQ/L-%-% IV SOLN
INTRAVENOUS | Status: DC
  Administered 2012-03-22: 13:00:00 via INTRAVENOUS
  Filled 2012-03-22 (×2): qty 1000

## 2012-03-22 MED ORDER — SODIUM CHLORIDE 0.9 % IR SOLN
Status: DC | PRN
  Administered 2012-03-22: 1000 mL

## 2012-03-22 MED ORDER — HYDROMORPHONE HCL PF 1 MG/ML IJ SOLN
0.5000 mg | INTRAMUSCULAR | Status: DC | PRN
  Administered 2012-03-23: 1 mg via INTRAVENOUS
  Filled 2012-03-22: qty 1

## 2012-03-22 MED ORDER — MEPERIDINE HCL 25 MG/ML IJ SOLN: 6.2500 mg | INTRAMUSCULAR | Status: DC | PRN

## 2012-03-22 MED ORDER — ONDANSETRON HCL 4 MG/2ML IJ SOLN
4.0000 mg | Freq: Once | INTRAMUSCULAR | Status: AC
  Administered 2012-03-22: 4 mg via INTRAVENOUS
  Filled 2012-03-22: qty 2

## 2012-03-22 MED ORDER — OXYCODONE HCL 5 MG PO TABS: 5.0000 mg | ORAL_TABLET | Freq: Once | ORAL | Status: AC | PRN

## 2012-03-22 MED ORDER — PANTOPRAZOLE SODIUM 40 MG IV SOLR: 40.0000 mg | Freq: Every day | INTRAVENOUS | Status: DC

## 2012-03-22 MED ORDER — ONDANSETRON HCL 4 MG/2ML IJ SOLN
INTRAMUSCULAR | Status: AC
  Administered 2012-03-22: 4 mg via INTRAVENOUS
  Filled 2012-03-22: qty 2

## 2012-03-22 MED ORDER — SODIUM CHLORIDE 0.9 % IV SOLN: INTRAVENOUS | Status: DC

## 2012-03-22 MED ORDER — LACTATED RINGERS IV SOLN
INTRAVENOUS | Status: DC
  Administered 2012-03-22: 15:00:00 via INTRAVENOUS

## 2012-03-22 MED ORDER — HYDROMORPHONE HCL PF 1 MG/ML IJ SOLN
0.2500 mg | INTRAMUSCULAR | Status: DC | PRN
  Administered 2012-03-22 (×3): 0.5 mg via INTRAVENOUS

## 2012-03-22 MED ORDER — SODIUM CHLORIDE 0.9 % IV SOLN
3.0000 g | Freq: Once | INTRAVENOUS | Status: AC
  Administered 2012-03-22: 3 g via INTRAVENOUS
  Filled 2012-03-22: qty 3

## 2012-03-22 MED ORDER — ARTIFICIAL TEARS OP OINT
TOPICAL_OINTMENT | OPHTHALMIC | Status: DC | PRN
  Administered 2012-03-22: 1 via OPHTHALMIC

## 2012-03-22 MED ORDER — BUPIVACAINE-EPINEPHRINE 0.25% -1:200000 IJ SOLN
INTRAMUSCULAR | Status: DC | PRN
  Administered 2012-03-22: 19 mL

## 2012-03-22 MED ORDER — SUCCINYLCHOLINE CHLORIDE 20 MG/ML IJ SOLN
INTRAMUSCULAR | Status: DC | PRN
  Administered 2012-03-22: 100 mg via INTRAVENOUS

## 2012-03-22 MED ORDER — MORPHINE SULFATE 2 MG/ML IJ SOLN
2.0000 mg | Freq: Once | INTRAMUSCULAR | Status: AC
  Administered 2012-03-22: 2 mg via INTRAVENOUS
  Filled 2012-03-22: qty 1

## 2012-03-22 MED ORDER — LACTATED RINGERS IV SOLN
INTRAVENOUS | Status: DC | PRN
  Administered 2012-03-22 (×2): via INTRAVENOUS

## 2012-03-22 MED ORDER — MORPHINE SULFATE 2 MG/ML IJ SOLN: 1.0000 mg | INTRAMUSCULAR | Status: DC | PRN

## 2012-03-22 MED ORDER — MIDAZOLAM HCL 2 MG/2ML IJ SOLN: 0.5000 mg | Freq: Once | INTRAMUSCULAR | Status: AC | PRN

## 2012-03-22 MED ORDER — PROPOFOL 10 MG/ML IV BOLUS
INTRAVENOUS | Status: DC | PRN
  Administered 2012-03-22: 200 mg via INTRAVENOUS
  Administered 2012-03-22: 40 mg via INTRAVENOUS

## 2012-03-22 MED ORDER — OXYCODONE HCL 5 MG/5ML PO SOLN: 5.0000 mg | Freq: Once | ORAL | Status: AC | PRN

## 2012-03-22 MED ORDER — SODIUM CHLORIDE 0.9 % IV BOLUS (SEPSIS)
1000.0000 mL | Freq: Once | INTRAVENOUS | Status: AC
  Administered 2012-03-22: 1000 mL via INTRAVENOUS

## 2012-03-22 MED ORDER — SODIUM CHLORIDE 0.9 % IV SOLN
INTRAVENOUS | Status: DC | PRN
  Administered 2012-03-22: 17:00:00

## 2012-03-22 SURGICAL SUPPLY — 45 items
ADH SKN CLS APL DERMABOND .7 (GAUZE/BANDAGES/DRESSINGS) ×2
APPLIER CLIP 5 13 M/L LIGAMAX5 (MISCELLANEOUS) ×3
APPLIER CLIP ROT 10 11.4 M/L (STAPLE)
APR CLP MED LRG 11.4X10 (STAPLE)
APR CLP MED LRG 5 ANG JAW (MISCELLANEOUS) ×2
BAG SPEC RTRVL LRG 6X4 10 (ENDOMECHANICALS) ×2
BLADE SURG ROTATE 9660 (MISCELLANEOUS) IMPLANT
CANISTER SUCTION 2500CC (MISCELLANEOUS) ×3 IMPLANT
CHLORAPREP W/TINT 26ML (MISCELLANEOUS) ×3 IMPLANT
CLIP APPLIE 5 13 M/L LIGAMAX5 (MISCELLANEOUS) ×2 IMPLANT
CLIP APPLIE ROT 10 11.4 M/L (STAPLE) IMPLANT
CLOTH BEACON ORANGE TIMEOUT ST (SAFETY) ×3 IMPLANT
CLSR STERI-STRIP ANTIMIC 1/2X4 (GAUZE/BANDAGES/DRESSINGS) ×2 IMPLANT
COVER MAYO STAND STRL (DRAPES) ×3 IMPLANT
COVER SURGICAL LIGHT HANDLE (MISCELLANEOUS) ×3 IMPLANT
DECANTER SPIKE VIAL GLASS SM (MISCELLANEOUS) ×4 IMPLANT
DERMABOND ADVANCED (GAUZE/BANDAGES/DRESSINGS) ×1
DERMABOND ADVANCED .7 DNX12 (GAUZE/BANDAGES/DRESSINGS) ×2 IMPLANT
DRAPE C-ARM 42X72 X-RAY (DRAPES) ×3 IMPLANT
DRAPE UTILITY 15X26 W/TAPE STR (DRAPE) ×6 IMPLANT
DRSG TEGADERM 4X4.75 (GAUZE/BANDAGES/DRESSINGS) ×8 IMPLANT
ELECT REM PT RETURN 9FT ADLT (ELECTROSURGICAL) ×3
ELECTRODE REM PT RTRN 9FT ADLT (ELECTROSURGICAL) ×2 IMPLANT
GLOVE BIOGEL PI IND STRL 8 (GLOVE) ×2 IMPLANT
GLOVE BIOGEL PI INDICATOR 8 (GLOVE) ×1
GLOVE ECLIPSE 7.5 STRL STRAW (GLOVE) ×3 IMPLANT
GOWN STRL NON-REIN LRG LVL3 (GOWN DISPOSABLE) ×10 IMPLANT
KIT BASIN OR (CUSTOM PROCEDURE TRAY) ×3 IMPLANT
KIT ROOM TURNOVER OR (KITS) ×3 IMPLANT
NS IRRIG 1000ML POUR BTL (IV SOLUTION) ×3 IMPLANT
PAD ARMBOARD 7.5X6 YLW CONV (MISCELLANEOUS) ×6 IMPLANT
POUCH SPECIMEN RETRIEVAL 10MM (ENDOMECHANICALS) ×2 IMPLANT
SCISSORS LAP 5X35 DISP (ENDOMECHANICALS) IMPLANT
SET CHOLANGIOGRAPH 5 50 .035 (SET/KITS/TRAYS/PACK) ×3 IMPLANT
SET IRRIG TUBING LAPAROSCOPIC (IRRIGATION / IRRIGATOR) ×3 IMPLANT
SLEEVE ENDOPATH XCEL 5M (ENDOMECHANICALS) ×6 IMPLANT
SPECIMEN JAR SMALL (MISCELLANEOUS) ×3 IMPLANT
SUT MNCRL AB 4-0 PS2 18 (SUTURE) ×3 IMPLANT
TOWEL OR 17X24 6PK STRL BLUE (TOWEL DISPOSABLE) ×3 IMPLANT
TOWEL OR 17X26 10 PK STRL BLUE (TOWEL DISPOSABLE) ×3 IMPLANT
TRAY LAPAROSCOPIC (CUSTOM PROCEDURE TRAY) ×3 IMPLANT
TROCAR XCEL BLUNT TIP 100MML (ENDOMECHANICALS) ×3 IMPLANT
TROCAR XCEL NON-BLD 11X100MML (ENDOMECHANICALS) IMPLANT
TROCAR XCEL NON-BLD 5MMX100MML (ENDOMECHANICALS) ×3 IMPLANT
WATER STERILE IRR 1000ML POUR (IV SOLUTION) IMPLANT

## 2012-03-22 NOTE — ED Notes (Signed)
X-ray at bedside

## 2012-03-22 NOTE — Anesthesia Preprocedure Evaluation (Signed)
Anesthesia Evaluation  Patient identified by MRN, date of birth, ID band Patient awake    Reviewed: Allergy & Precautions, H&P , NPO status , Patient's Chart, lab work & pertinent test results  Airway Mallampati: II TM Distance: >3 FB Neck ROM: Full    Dental  (+) Teeth Intact and Dental Advisory Given   Pulmonary          Cardiovascular + dysrhythmias     Neuro/Psych Myasthenia Gravis - only occular affected at this time.  Neuromuscular disease    GI/Hepatic   Endo/Other    Renal/GU      Musculoskeletal   Abdominal   Peds  Hematology   Anesthesia Other Findings   Reproductive/Obstetrics                           Anesthesia Physical Anesthesia Plan  ASA: II  Anesthesia Plan: General   Post-op Pain Management:    Induction: Intravenous  Airway Management Planned: Oral ETT  Additional Equipment:   Intra-op Plan:   Post-operative Plan: Extubation in OR  Informed Consent: I have reviewed the patients History and Physical, chart, labs and discussed the procedure including the risks, benefits and alternatives for the proposed anesthesia with the patient or authorized representative who has indicated his/her understanding and acceptance.   Dental advisory given  Plan Discussed with: CRNA, Anesthesiologist and Surgeon  Anesthesia Plan Comments:         Anesthesia Quick Evaluation

## 2012-03-22 NOTE — ED Provider Notes (Signed)
History     CSN: [id number]  Arrival date & time 03/22/12  1308   First MD Initiated Contact with Patient 03/22/12 0250      Chief Complaint  Patient presents with  . Abdominal Pain     Patient is a 47 y.o. female presenting with abdominal pain. The history is provided by the patient.  Abdominal Pain Pain location:  Epigastric Pain quality: squeezing   Pain radiates to:  Does not radiate Pain severity:  Moderate Onset quality:  Gradual Timing:  Constant Progression:  Unchanged Chronicity:  Recurrent Context: eating   Relieved by:  Nothing Worsened by:  Eating Associated symptoms: nausea   Associated symptoms: no chest pain, no diarrhea, no dysuria, no fever, no shortness of breath, no vaginal bleeding, no vaginal discharge and no vomiting   pt reports she has known h/o gallstones She reports after eating dinner last night, she started to notice epigastric abd. Pain.  She reports similar to prior gallstones  Past Medical History  Diagnosis Date  . Gallstones   . Myasthenia gravis   . PVC (premature ventricular contraction)   . Ectopic pregnancy     Past Surgical History  Procedure Laterality Date  . Cesarean section    . Oophorectomy Left     No family history on file.  History  Substance Use Topics  . Smoking status: Never Smoker   . Smokeless tobacco: Never Used  . Alcohol Use: Yes     Comment: rarely; 1 glass of wine a month     OB History   Grav Para Term Preterm Abortions TAB SAB Ect Mult Living                  Review of Systems  Constitutional: Negative for fever.  Respiratory: Negative for shortness of breath.   Cardiovascular: Negative for chest pain.  Gastrointestinal: Positive for nausea and abdominal pain. Negative for vomiting and diarrhea.  Genitourinary: Negative for dysuria, vaginal bleeding and vaginal discharge.  Neurological: Negative for weakness.  All other systems reviewed and are negative.    Allergies  Review of  patient's allergies indicates no known allergies.  Home Medications  No current outpatient prescriptions on file.  BP 136/75  Pulse 68  Temp(Src) 98 F (36.7 C) (Oral)  Resp 18  SpO2 98%  LMP 02/10/2012 BP 117/68  Pulse 77  Temp(Src) 98 F (36.7 C) (Oral)  Resp 14  SpO2 97%  LMP 02/10/2012  Physical Exam CONSTITUTIONAL: Well developed/well nourished HEAD: Normocephalic/atraumatic EYES: EOMI/PERRL ENMT: Mucous membranes moist NECK: supple no meningeal signs SPINE:entire spine nontender CV: S1/S2 noted, no murmurs/rubs/gallops noted LUNGS: Lungs are clear to auscultation bilaterally, no apparent distress ABDOMEN: soft, mild epigastric tenderness, no rebound or guarding GU:no cva tenderness NEURO: Pt is awake/alert, moves all extremitiesx4 EXTREMITIES: pulses normal, full ROM SKIN: warm, color normal PSYCH: no abnormalities of mood noted  ED Course  Procedures (including critical care time)  Labs Reviewed  CBC WITH DIFFERENTIAL  COMPREHENSIVE METABOLIC PANEL  LIPASE, BLOOD  URINALYSIS, MICROSCOPIC ONLY   5:24 AM Pt with worsening pain, now requesting pain meds (she did not want any earlier tonight) reports pain similar to prior episodes of biliary colic 6:51 AM D/w surgery.  Since [place] was over a year ago, would recommend repeat US.   Pt still with pain, though does not wish to have any further pain meds I explained delays but would recommend ultrasound She agrees She has no lower abdominal tenderness, localizes to epigastric/RUQ  MDM  Nursing notes including past medical history and social history reviewed and considered in documentation Previous records reviewed and considered - previous US results reveal h/o cholelithiasis Labs/vital reviewed and considered        Date: 03/22/2012  Rate: 63  Rhythm: normal sinus rhythm  QRS Axis: normal  Intervals: normal  ST/T Wave abnormalities: nonspecific ST changes  Conduction  Disutrbances:none     Joya Gaskins, MD 03/22/12 [phone]

## 2012-03-22 NOTE — ED Notes (Signed)
Attempt to call report x3

## 2012-03-22 NOTE — ED Notes (Signed)
Attempt to call report x 1. Notified radiology need for portable chest xray

## 2012-03-22 NOTE — Anesthesia Procedure Notes (Signed)
Procedure Name: Intubation Date/Time: 03/22/2012 4:25 PM Performed by: Luster Landsberg Pre-anesthesia Checklist: Patient identified, Emergency Drugs available, Suction available and Patient being monitored Patient Re-evaluated:Patient Re-evaluated prior to inductionOxygen Delivery Method: Circle system utilized Preoxygenation: Pre-oxygenation with 100% oxygen Intubation Type: IV induction Ventilation: Mask ventilation without difficulty Laryngoscope Size: Mac and 3 Grade View: Grade I Tube type: Oral Tube size: 7.0 mm Airway Equipment and Method: Stylet and LTA kit utilized Placement Confirmation: ETT inserted through vocal cords under direct vision,  breath sounds checked- equal and bilateral and positive ETCO2 Secured at: 20 cm Tube secured with: Tape Dental Injury: Teeth and Oropharynx as per pre-operative assessment

## 2012-03-22 NOTE — ED Provider Notes (Signed)
Kelli Jakes, MD   Results for orders placed during the hospital encounter of 03/22/12  CBC WITH DIFFERENTIAL      Result Value Range   WBC 8.3  4.0 - 10.5 K/uL   RBC 4.09  3.87 - 5.11 MIL/uL   Hemoglobin 12.8  12.0 - 15.0 g/dL   HCT 16.1  09.6 - 04.5 %   MCV 92.4  78.0 - 100.0 fL   MCH 31.3  26.0 - 34.0 pg   MCHC 33.9  30.0 - 36.0 g/dL   RDW 40.9  81.1 - 91.4 %   Platelets 241  150 - 400 K/uL   Neutrophils Relative 49  43 - 77 %   Neutro Abs 4.1  1.7 - 7.7 K/uL   Lymphocytes Relative 42  12 - 46 %   Lymphs Abs 3.5  0.7 - 4.0 K/uL   Monocytes Relative 6  3 - 12 %   Monocytes Absolute 0.5  0.1 - 1.0 K/uL   Eosinophils Relative 3  0 - 5 %   Eosinophils Absolute 0.2  0.0 - 0.7 K/uL   Basophils Relative 0  0 - 1 %   Basophils Absolute 0.0  0.0 - 0.1 K/uL  COMPREHENSIVE METABOLIC PANEL      Result Value Range   Sodium 143  135 - 145 mEq/L   Potassium 3.7  3.5 - 5.1 mEq/L   Chloride 105  96 - 112 mEq/L   CO2 28  19 - 32 mEq/L   Glucose, Bld 112 (*) 70 - 99 mg/dL   BUN 18  6 - 23 mg/dL   Creatinine, Ser 7.82  0.50 - 1.10 mg/dL   Calcium 9.5  8.4 - 95.6 mg/dL   Total Protein 7.0  6.0 - 8.3 g/dL   Albumin 3.5  3.5 - 5.2 g/dL   AST 17  0 - 37 U/L   ALT 14  0 - 35 U/L   Alkaline Phosphatase 72  39 - 117 U/L   Total Bilirubin 0.2 (*) 0.3 - 1.2 mg/dL   GFR calc non Af Amer >90  >90 mL/min   GFR calc Af Amer >90  >90 mL/min  LIPASE, BLOOD      Result Value Range   Lipase 46  11 - 59 U/L  URINALYSIS, MICROSCOPIC ONLY      Result Value Range   Color, Urine YELLOW  YELLOW   APPearance CLEAR  CLEAR   Specific Gravity, Urine 1.026  1.005 - 1.030   pH 5.5  5.0 - 8.0   Glucose, UA NEGATIVE  NEGATIVE mg/dL   Hgb urine dipstick NEGATIVE  NEGATIVE   Bilirubin Urine NEGATIVE  NEGATIVE   Ketones, ur NEGATIVE  NEGATIVE mg/dL   Protein, ur NEGATIVE  NEGATIVE mg/dL   Urobilinogen, UA 0.2  0.0 - 1.0 mg/dL   Nitrite NEGATIVE  NEGATIVE   Leukocytes, UA NEGATIVE  NEGATIVE   WBC, UA  0-2  <3 WBC/hpf   RBC / HPF 0-2  <3 RBC/hpf   Bacteria, UA RARE  RARE   Squamous Epithelial / LPF FEW (*) RARE   Urine-Other MUCOUS PRESENT    POCT PREGNANCY, URINE      Result Value Range   Preg Test, Ur NEGATIVE  NEGATIVE  POCT I-STAT TROPONIN I      Result Value Range   Troponin i, poc 0.00  0.00 - 0.08 ng/mL   Comment 3            US Abdomen  Complete  03/22/2012   *RADIOLOGY REPORT*  Clinical Data:  Abdominal pain  ABDOMINAL ULTRASOUND COMPLETE  Comparison:  07/26/2010  Findings:  Gallbladder:   Gallbladder wall is mildly thickened measuring 3.7 mm.  Sludge is identified within the dependent portion of the gallbladder.  Several mobile stones are identified measuring up to 1.3 cm.  A stone is also noted to be lodged within the neck of the gallbladder.Negative sonographic Murphy's sign.  Common Bile Duct:  Increase in caliber measuring 8.8 mm. Previously 5 mm.  Liver: No focal mass lesion identified.  Within normal limits in parenchymal echogenicity.  IVC:  Appears normal.  Pancreas:  No abnormality identified.  Spleen:  Within normal limits in size and echotexture.  Right kidney:  Normal in size and parenchymal echogenicity.  No evidence of mass or hydronephrosis.  Left kidney:  Normal in size and parenchymal echogenicity.  No evidence of mass or hydronephrosis.  Abdominal Aorta:  No aneurysm identified.  IMPRESSION:  1.  New gallbladder wall thickening and increased caliber of the common bile duct.  The patient is again noted to have stones and sludge within the gallbladder.  Cannot rule out cholecystitis.   Original Report Authenticated By: Signa Kell, M.D.     The patient's ultrasound consistent with acute cholecystitis. Reconsult surgery they will see and admit.  Kelli Jakes, MD 03/22/12 [phone]

## 2012-03-22 NOTE — Op Note (Signed)
OPERATIVE REPORT  DATE OF OPERATION: 03/22/2012  PATIENT:  Kelli Stephens  47 y.o. female  PRE-OPERATIVE DIAGNOSIS:  Biliary Colic  POST-OPERATIVE DIAGNOSIS:  Biliary Colic  PROCEDURE:  Procedure(s): LAPAROSCOPIC CHOLECYSTECTOMY  SURGEON:  Surgeon(s): Cherylynn Ridges, MD Liz Malady, MD  ASSISTANT: Janee Morn  ANESTHESIA:   general  EBL: <30 ml  BLOOD ADMINISTERED: none  DRAINS: none   SPECIMEN:  Source of Specimen:  Gallbladder and stones  COUNTS CORRECT:  YES  PROCEDURE DETAILS: The patient was taken to the operating room and placed on the table in the supine position.  After an adequate endotracheal anesthetic was administered, (she/he) was prepped with (ChloroPrep/Betadine), and then draped in the usual manner exposing the entire abdomen laterally, inferiorly and up  to the costal margins.  After a proper timeout was performed including identifying the patient and the procedure to be performed, a supra-umbilical 1.5cm midline incision was made using a #15 blade.  This was taken down to the fascia which was then incised with a #15 blade.  The edges of the fascia were tented up with Kocher clamps as the preperitoneal space was penetrated with a Kelly clamp into the peritoneum.  Once this was done, a pursestring suture of 0 Vicryl was passed around the fascial opening.  This was subsequently used to secure the [facility] cannula which was passed into the peritoneal cavity.  Once the [facility] cannula was in place, carbon dioxide gas was insufflated into the peritoneal cavity up to a maximal intra-abdominal pressure of 15mm Hg.The laparoscope, with attached camera and light source, was passed into the peritoneal cavity to visualize the direct insertion of two right upper quadrant 5mm cannulas, and a sup-xiphoid 5mm cannula.  Once all cannulas were in place, the dissection was begun.  Two ratcheted graspers were attached to the dome and infundibulum of the gallbladder and retracted  towards the anterior abdominal wall and the right upper quadrant.  Using cautery attached to a dissecting forceps, the peritoneum overlaying the triangle of Chalot and the hepatoduodenal triangle was dissected away exposing the cystic duct and the cystic artery.  A clip was placed on the gallbladder side of the cystic duct, and the distal cystic duct was clipped multiple times then transected.  The gallbladder was then dissected out of the hepatic bed without event.  It was retrieved from the abdomen (using an EndoCatch bag) without event.  The cystic duct was too small to safely perform a cholangiogram.  Once the gallbladder was removed, the bed was inspected for hemostasis.  Once excellent hemostasis was obtained all gas and fluids were aspirated from above the liver, then the cannulas were removed.  The supra-umbilical incision was closed using the pursestring suture which was in place.  0.25% bupivicaine with epinephrine was injected at all sites.  All 10mm or greater cannula sites were close using a running subcuticular stitch of 4-0 Monocryl.  5.64mm cannula sites were closed with Dermabond only.Steri-Strips and Tagaderm were used to complete the dressings at all sites.  At this point all needle, sponge, and instrument counts were correct.The patient was awakened from anesthesia and taken to the PACU in stable condition.  PATIENT DISPOSITION:  PACU - hemodynamically stable.   Cherylynn Ridges 2/25/20145:31 PM

## 2012-03-22 NOTE — Transfer of Care (Signed)
Immediate Anesthesia Transfer of Care Note  Patient: Kelli Stephens  Procedure(s) Performed: Procedure(s): LAPAROSCOPIC CHOLECYSTECTOMY (N/A)  Patient Location: PACU  Anesthesia Type:General  Level of Consciousness: awake, oriented, patient cooperative and responds to stimulation  Airway & Oxygen Therapy: Patient Spontanous Breathing and Patient connected to nasal cannula oxygen  Post-op Assessment: Report given to PACU RN, Post -op Vital signs reviewed and stable and Patient moving all extremities X 4  Post vital signs: Reviewed and stable  Complications: No apparent anesthesia complications

## 2012-03-22 NOTE — ED Notes (Signed)
Patient presents with c/o epigastric pain and "waves" of nausea that started last night. Pain subsided without any intervention but has come back tonight. Hx gallstones. Describes pain as squeezing and rates 9/10. Denies vomiting, diarrhea, constipation, fever or chills.

## 2012-03-22 NOTE — ED Notes (Signed)
Patient transported to Ultrasound 

## 2012-03-22 NOTE — ED Notes (Signed)
Surgery in to see patient at bedside.

## 2012-03-22 NOTE — H&P (Signed)
Kelli Stephens 05-09-1965  [id number].   Chief Complaint/Reason for Consult: gallstones, abdominal pain HPI: this is a 47 yo female who has a known h/o gallstones who had an episode in 2012, but none since then.  She just finished a 10day course of abx therapy for bronchitis, as a side note.  On Sunday, she developed some epigastric abdominal pain.  It did ease off on Monday, but worsened Monday night after eating dinner.  She has nausea, but no emesis.  No diarrhea.  Due to worsening pain, she came to the ED today and was found to have gallstones and one in the gallbladder neck.  We have been asked to see the patient for admission.  Review of Systems: please see HPI, otherwise c/o cough since she was diagnosed with bronchitis almost 2 weeks ago.  No family history on file.  Past Medical History  Diagnosis Date  . Gallstones   . Myasthenia gravis   . PVC (premature ventricular contraction)   . Ectopic pregnancy     Past Surgical History  Procedure Laterality Date  . Cesarean section    . Oophorectomy Left     Social History:  reports that she has never smoked. She has never used smokeless tobacco. She reports that  drinks alcohol. She reports that she does not use illicit drugs.  Allergies: No Known Allergies   (Not in a hospital admission)  Blood pressure 115/69, pulse 62, temperature 98 F (36.7 C), temperature source Oral, resp. rate 15, last menstrual period 02/10/2012, SpO2 97.00%. Physical Exam: General: pleasant, WD, WN white female who is laying in bed in NAD HEENT: head is normocephalic, atraumatic.  Sclera are noninjected.  PERRL.  Ears and nose without any masses or lesions.  Mouth is pink and moist Heart: regular, rate, and rhythm.  Normal s1,s2. No obvious murmurs, gallops, or rubs noted.  Palpable radial and pedal pulses bilaterally Lungs: CTAB, no wheezes, rhonchi, or rales noted.  Respiratory effort nonlabored Abd: soft, mild tenderness in RUQ and epigastrium, ND,  +BS, no masses, hernias, or organomegaly MS: all 4 extremities are symmetrical with no cyanosis, clubbing, or edema. Skin: warm and dry with no masses, lesions, or rashes Psych: A&Ox3 with an appropriate affect.    Results for orders placed during the hospital encounter of 03/22/12 (from the past 48 hour(s))  CBC WITH DIFFERENTIAL     Status: None   Collection Time    03/22/12  2:50 AM      Result Value Range   WBC 8.3  4.0 - 10.5 K/uL   RBC 4.09  3.87 - 5.11 MIL/uL   Hemoglobin 12.8  12.0 - 15.0 g/dL   HCT 02.7  25.3 - 66.4 %   MCV 92.4  78.0 - 100.0 fL   MCH 31.3  26.0 - 34.0 pg   MCHC 33.9  30.0 - 36.0 g/dL   RDW 40.3  47.4 - 25.9 %   Platelets 241  150 - 400 K/uL   Neutrophils Relative 49  43 - 77 %   Neutro Abs 4.1  1.7 - 7.7 K/uL   Lymphocytes Relative 42  12 - 46 %   Lymphs Abs 3.5  0.7 - 4.0 K/uL   Monocytes Relative 6  3 - 12 %   Monocytes Absolute 0.5  0.1 - 1.0 K/uL   Eosinophils Relative 3  0 - 5 %   Eosinophils Absolute 0.2  0.0 - 0.7 K/uL   Basophils Relative 0  0 - 1 %  Basophils Absolute 0.0  0.0 - 0.1 K/uL  COMPREHENSIVE METABOLIC PANEL     Status: Abnormal   Collection Time    03/22/12  2:50 AM      Result Value Range   Sodium 143  135 - 145 mEq/L   Potassium 3.7  3.5 - 5.1 mEq/L   Chloride 105  96 - 112 mEq/L   CO2 28  19 - 32 mEq/L   Glucose, Bld 112 (*) 70 - 99 mg/dL   BUN 18  6 - 23 mg/dL   Creatinine, Ser 1.61  0.50 - 1.10 mg/dL   Calcium 9.5  8.4 - 09.6 mg/dL   Total Protein 7.0  6.0 - 8.3 g/dL   Albumin 3.5  3.5 - 5.2 g/dL   AST 17  0 - 37 U/L   ALT 14  0 - 35 U/L   Alkaline Phosphatase 72  39 - 117 U/L   Total Bilirubin 0.2 (*) 0.3 - 1.2 mg/dL   GFR calc non Af Amer >90  >90 mL/min   GFR calc Af Amer >90  >90 mL/min   Comment:            The eGFR has been calculated     using the CKD EPI equation.     This calculation has not been     validated in all clinical     situations.     eGFR's persistently     <90 mL/min signify      possible Chronic Kidney Disease.  LIPASE, BLOOD     Status: None   Collection Time    03/22/12  2:50 AM      Result Value Range   Lipase 46  11 - 59 U/L  URINALYSIS, MICROSCOPIC ONLY     Status: Abnormal   Collection Time    03/22/12  3:25 AM      Result Value Range   Color, Urine YELLOW  YELLOW   APPearance CLEAR  CLEAR   Specific Gravity, Urine 1.026  1.005 - 1.030   pH 5.5  5.0 - 8.0   Glucose, UA NEGATIVE  NEGATIVE mg/dL   Hgb urine dipstick NEGATIVE  NEGATIVE   Bilirubin Urine NEGATIVE  NEGATIVE   Ketones, ur NEGATIVE  NEGATIVE mg/dL   Protein, ur NEGATIVE  NEGATIVE mg/dL   Urobilinogen, UA 0.2  0.0 - 1.0 mg/dL   Nitrite NEGATIVE  NEGATIVE   Leukocytes, UA NEGATIVE  NEGATIVE   WBC, UA 0-2  <3 WBC/hpf   RBC / HPF 0-2  <3 RBC/hpf   Bacteria, UA RARE  RARE   Squamous Epithelial / LPF FEW (*) RARE   Urine-Other MUCOUS PRESENT    POCT I-STAT TROPONIN I     Status: None   Collection Time    03/22/12  3:29 AM      Result Value Range   Troponin i, poc 0.00  0.00 - 0.08 ng/mL   Comment 3            Comment: Due to the release kinetics of cTnI,     a negative result within the first hours     of the onset of symptoms does not rule out     myocardial infarction with certainty.     If myocardial infarction is still suspected,     repeat the test at appropriate intervals.  POCT PREGNANCY, URINE     Status: None   Collection Time    03/22/12  3:30 AM  Result Value Range   Preg Test, Ur NEGATIVE  NEGATIVE   Comment:            THE SENSITIVITY OF THIS     METHODOLOGY IS >24 mIU/mL   US Abdomen Complete  03/22/2012   *RADIOLOGY REPORT*  Clinical Data:  Abdominal pain  ABDOMINAL ULTRASOUND COMPLETE  Comparison:  07/26/2010  Findings:  Gallbladder:   Gallbladder wall is mildly thickened measuring 3.7 mm.  Sludge is identified within the dependent portion of the gallbladder.  Several mobile stones are identified measuring up to 1.3 cm.  A stone is also noted to be lodged within  the neck of the gallbladder.Negative sonographic Murphy's sign.  Common Bile Duct:  Increase in caliber measuring 8.8 mm. Previously 5 mm.  Liver: No focal mass lesion identified.  Within normal limits in parenchymal echogenicity.  IVC:  Appears normal.  Pancreas:  No abnormality identified.  Spleen:  Within normal limits in size and echotexture.  Right kidney:  Normal in size and parenchymal echogenicity.  No evidence of mass or hydronephrosis.  Left kidney:  Normal in size and parenchymal echogenicity.  No evidence of mass or hydronephrosis.  Abdominal Aorta:  No aneurysm identified.  IMPRESSION:  1.  New gallbladder wall thickening and increased caliber of the common bile duct.  The patient is again noted to have stones and sludge within the gallbladder.  Cannot rule out cholecystitis.   Original Report Authenticated By: Signa Kell, M.D.        Assessment/Plan 1. Biliary colic, early acute cholecystitis 2. Recent bronchitis 3. Myasthenia gravis  Plan: 1. Will admit the patient for cholecystectomy.  I have d/w the patient the procedure along with risks and complications including, but not limited to bleeding, infection, bile leak, abscess, wound infection, CBD injury, retained gallstones, need for ERCP or drain placement, and need to convert to open procedure.  She understands and is agreeable to proceed.  Reannon Candella E 03/22/2012, 10:24 AM Pager: [phone]

## 2012-03-23 MED ORDER — OXYCODONE-ACETAMINOPHEN 5-325 MG PO TABS: 1.0000 | ORAL_TABLET | ORAL | Status: DC | PRN

## 2012-03-23 MED ORDER — MAGNESIUM HYDROXIDE 400 MG/5ML PO SUSP
30.0000 mL | Freq: Once | ORAL | Status: AC
  Administered 2012-03-23: 30 mL via ORAL
  Filled 2012-03-23: qty 30

## 2012-03-23 NOTE — H&P (Signed)
Updated H&P.  Biliary colic.  Okay for Lap cholecystectomy  Marta Lamas. Gae Bon, MD, FACS [phone] [facility]

## 2012-03-23 NOTE — Progress Notes (Signed)
Notified Dr. Magnus Ivan that pt requests something for constipation. Received order for one time dose of milk of magnesia per pharmacy.

## 2012-03-23 NOTE — Discharge Summary (Addendum)
  Physician Discharge Summary  Patient ID: Kelli Stephens MRN: [id number] DOB/AGE: Jul 04, 1965 47 y.o.  Admit date: 03/22/2012 Discharge date: 03/24/2012  Admitting Diagnosis: Bilary Colic   Discharge Diagnosis Bilary Colic   Consultants None  Procedures Laparoscopic Cholecystectomy   Hospital Course 47 yr old female who presented to [facility] with abdominal pain.  Workup showed bilary colic.  Patient was admitted and underwent procedure listed above.  Tolerated procedure well and was transferred to the floor.  Diet was advanced as tolerated.  On POD#2, the patient was voiding well, tolerating diet, ambulating well, pain well controlled, vital signs stable, incisions c/d/i and felt stable for discharge home.  Patient will follow up in our office in 2 weeks and knows to call with questions or concerns.    Medication List    TAKE these medications       oxyCODONE-acetaminophen 5-325 MG per tablet  Commonly known as:  PERCOCET/ROXICET  Take 1-2 tablets by mouth every 4 (four) hours as needed.             Follow-up Information   Follow up with [facility].   Contact information:   [address] [place] [address] [phone]       Signed: Denny Levy [facility] [phone]  03/23/2012, 8:33 AM

## 2012-03-23 NOTE — Discharge Instructions (Signed)
[facility] ______[facility] °LAPAROSCOPIC SURGERY: POST OP INSTRUCTIONS °Always review your discharge instruction sheet given to you by the facility where your surgery was performed. °IF YOU HAVE DISABILITY OR FAMILY LEAVE FORMS, YOU MUST BRING THEM TO THE OFFICE FOR PROCESSING.   °DO NOT GIVE THEM TO YOUR DOCTOR. ° °1. A prescription for pain medication may be given to you upon discharge.  Take your pain medication as prescribed, if needed.  If narcotic pain medicine is not needed, then you may take acetaminophen (Tylenol) or ibuprofen (Advil) as needed. °2. Take your usually prescribed medications unless otherwise directed. °3. If you need a refill on your pain medication, please contact your pharmacy.  They will contact our office to request authorization. Prescriptions will not be filled after 5pm or on week-ends. °4. You should follow a light diet the first few days after arrival home, such as soup and crackers, etc.  Be sure to include lots of fluids daily. °5. Most patients will experience some swelling and bruising in the area of the incisions.  Ice packs will help.  Swelling and bruising can take several days to resolve.  °6. It is common to experience some constipation if taking pain medication after surgery.  Increasing fluid intake and taking a stool softener (such as Colace) will usually help or prevent this problem from occurring.  A mild laxative (Milk of Magnesia or Miralax) should be taken according to package instructions if there are no bowel movements after 48 hours. °7. Unless discharge instructions indicate otherwise, you may remove your bandages 24-48 hours after surgery, and you may shower at that time.  You may have steri-strips (small skin tapes) in place directly over the incision.  These strips should be left on the skin for 7-10 days.  If your surgeon used skin glue on the incision, you may shower in 24 hours.  The glue will flake off over the next 2-3 weeks.  Any sutures or  staples will be removed at the office during your follow-up visit. °8. ACTIVITIES:  You may resume regular (light) daily activities beginning the next day--such as daily self-care, walking, climbing stairs--gradually increasing activities as tolerated.  You may have sexual intercourse when it is comfortable.  Refrain from any heavy lifting or straining until approved by your doctor. °a. You may drive when you are no longer taking prescription pain medication, you can comfortably wear a seatbelt, and you can safely maneuver your car and apply brakes. °b. RETURN TO WORK:  __________________________________________________________ °9. You should see your doctor in the office for a follow-up appointment approximately 2-3 weeks after your surgery.  Make sure that you call for this appointment within a day or two after you arrive home to insure a convenient appointment time. °10. OTHER INSTRUCTIONS: __________________________________________________________________________________________________________________________ __________________________________________________________________________________________________________________________ °WHEN TO CALL YOUR DOCTOR: °1. Fever over 101.0 °2. Inability to urinate °3. Continued bleeding from incision. °4. Increased pain, redness, or drainage from the incision. °5. Increasing abdominal pain ° °The clinic staff is available to answer your questions during regular business hours.  Please don’t hesitate to call and ask to speak to one of the nurses for clinical concerns.  If you have a medical emergency, go to the nearest emergency room or call 911.  A surgeon from [facility] is always on call at the hospital. °[address], [address], [place], [place]  [address] ? [address] [address], [place], [place]   [address] °[phone] ? [phone] ? FAX [phone] °Web site:   [email or web address] °

## 2012-03-24 ENCOUNTER — Encounter (HOSPITAL_COMMUNITY): Payer: Self-pay | Admitting: General Surgery

## 2012-03-24 MED ORDER — DOCUSATE SODIUM 100 MG PO CAPS: 200.0000 mg | ORAL_CAPSULE | Freq: Two times a day (BID) | ORAL | Status: DC | PRN

## 2012-03-24 MED ORDER — POLYETHYLENE GLYCOL 3350 17 G PO PACK
17.0000 g | PACK | Freq: Every day | ORAL | Status: DC | PRN
  Filled 2012-03-24: qty 1

## 2012-03-24 NOTE — Progress Notes (Signed)
Patient discharged to home with instructions. 

## 2012-03-24 NOTE — Anesthesia Postprocedure Evaluation (Signed)
  Anesthesia Post-op Note  Patient: Kelli Stephens  Procedure(s) Performed: Procedure(s): LAPAROSCOPIC CHOLECYSTECTOMY (N/A)  Patient Location: PACU  Anesthesia Type:General  Level of Consciousness: awake, alert , oriented and patient cooperative  Airway and Oxygen Therapy: Patient Spontanous Breathing and Patient connected to nasal cannula oxygen  Post-op Pain: mild  Post-op Assessment: Post-op Vital signs reviewed, Patient's Cardiovascular Status Stable, Respiratory Function Stable, Patent Airway, No signs of Nausea or vomiting and Pain level controlled  Post-op Vital Signs: Reviewed and stable  Complications: No apparent anesthesia complications

## 2012-04-01 ENCOUNTER — Encounter (INDEPENDENT_AMBULATORY_CARE_PROVIDER_SITE_OTHER): Payer: Self-pay

## 2012-04-12 ENCOUNTER — Ambulatory Visit (INDEPENDENT_AMBULATORY_CARE_PROVIDER_SITE_OTHER): Payer: 59 | Admitting: General Surgery

## 2012-04-12 ENCOUNTER — Encounter (INDEPENDENT_AMBULATORY_CARE_PROVIDER_SITE_OTHER): Payer: Self-pay

## 2012-04-12 VITALS — BP 110/68 | HR 71 | Temp 97.1°F | Resp 18 | Ht 65.0 in | Wt 166.2 lb

## 2012-04-12 DIAGNOSIS — Z9889 Other specified postprocedural states: Secondary | ICD-10-CM

## 2012-04-12 DIAGNOSIS — Z9049 Acquired absence of other specified parts of digestive tract: Secondary | ICD-10-CM

## 2012-04-12 DIAGNOSIS — K8020 Calculus of gallbladder without cholecystitis without obstruction: Secondary | ICD-10-CM

## 2012-04-12 NOTE — Patient Instructions (Signed)
She may resume a regular diet and full activity.  She may follow-up on a PRN basis. 

## 2012-04-12 NOTE — Progress Notes (Signed)
  Subjective: Kelli Stephens is a 47 y.o. female who had a laparoscopic cholecystectomy  on 03/22/12 by Dr. Lindie Spruce  returns to the clinic today.  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  The patient is tolerating their diet well and is having no severe pain.  Bowel function is good.  The pre-operative symptoms of abdominal pain, nausea, and vomiting have resolved.  No problems with the wounds.  Pt is returning to normal activity / work.   Objective: Vital signs in last 24 hours: Reviewed   PE: General:  Alert, NAD, pleasant Abdomen:  soft, NT/ND, +bs, incisions appear well-healed with no sign of infection or bleeding   Assessment/Plan  1.  S/P Laparoscopic Cholecystectomy: doing well, may resume regular activity without restrictions, Pt will follow up with [place] PRN and knows to call with questions or concerns.    2.  Chronic cough - talked about home care, and getting in to see her PCP about possible asthma like symptoms and allergies  DORT, Sarie Stall, PA-C 04/12/2012

## 2012-06-10 ENCOUNTER — Other Ambulatory Visit: Payer: Self-pay | Admitting: Family Medicine

## 2012-07-07 ENCOUNTER — Other Ambulatory Visit: Payer: Self-pay | Admitting: Physician Assistant

## 2012-07-08 ENCOUNTER — Other Ambulatory Visit (HOSPITAL_COMMUNITY)
Admission: RE | Admit: 2012-07-08 | Discharge: 2012-07-08 | Disposition: A | Discharge status: Home / Self Care | Payer: 59 | Source: Ambulatory Visit | Attending: Family Medicine | Admitting: Family Medicine

## 2012-07-08 DIAGNOSIS — Z0000 Encounter for general adult medical examination without abnormal findings: Secondary | ICD-10-CM | POA: Insufficient documentation

## 2013-03-06 ENCOUNTER — Encounter: Payer: Self-pay | Admitting: *Deleted

## 2013-05-30 ENCOUNTER — Encounter (HOSPITAL_COMMUNITY): Payer: Self-pay | Admitting: Emergency Medicine

## 2013-05-30 ENCOUNTER — Emergency Department (HOSPITAL_COMMUNITY): Payer: Worker's Compensation

## 2013-05-30 ENCOUNTER — Emergency Department (HOSPITAL_COMMUNITY)
Admission: EM | Admit: 2013-05-30 | Discharge: 2013-05-30 | Disposition: A | Discharge status: Home / Self Care | Payer: Worker's Compensation | Attending: Emergency Medicine | Admitting: Emergency Medicine

## 2013-05-30 DIAGNOSIS — Y929 Unspecified place or not applicable: Secondary | ICD-10-CM | POA: Insufficient documentation

## 2013-05-30 DIAGNOSIS — G7000 Myasthenia gravis without (acute) exacerbation: Secondary | ICD-10-CM | POA: Diagnosis not present

## 2013-05-30 DIAGNOSIS — X503XXA Overexertion from repetitive movements, initial encounter: Secondary | ICD-10-CM | POA: Diagnosis not present

## 2013-05-30 DIAGNOSIS — I4949 Other premature depolarization: Secondary | ICD-10-CM | POA: Insufficient documentation

## 2013-05-30 DIAGNOSIS — S8990XA Unspecified injury of unspecified lower leg, initial encounter: Secondary | ICD-10-CM | POA: Diagnosis present

## 2013-05-30 DIAGNOSIS — S93409A Sprain of unspecified ligament of unspecified ankle, initial encounter: Secondary | ICD-10-CM | POA: Diagnosis not present

## 2013-05-30 DIAGNOSIS — S99919A Unspecified injury of unspecified ankle, initial encounter: Secondary | ICD-10-CM | POA: Diagnosis present

## 2013-05-30 DIAGNOSIS — W010XXA Fall on same level from slipping, tripping and stumbling without subsequent striking against object, initial encounter: Secondary | ICD-10-CM | POA: Diagnosis not present

## 2013-05-30 DIAGNOSIS — Y939 Activity, unspecified: Secondary | ICD-10-CM | POA: Insufficient documentation

## 2013-05-30 DIAGNOSIS — S93401A Sprain of unspecified ligament of right ankle, initial encounter: Secondary | ICD-10-CM

## 2013-05-30 MED ORDER — HYDROCODONE-ACETAMINOPHEN 5-325 MG PO TABS: 1.0000 | ORAL_TABLET | ORAL | Status: DC | PRN

## 2013-05-30 MED ORDER — MELOXICAM 7.5 MG PO TABS: 15.0000 mg | ORAL_TABLET | Freq: Every day | ORAL | Status: DC

## 2013-05-30 NOTE — ED Provider Notes (Signed)
CSN: [id number]     Arrival date & time 05/30/13  1327 History   First MD Initiated Contact with Patient 05/30/13 1423    This chart was scribed for Junius FinnerErin O'Malley PA-C, a non-physician practitioner working with No att. providers found by Lewanda RifeAlexandra Hurtado, ED Scribe. This patient was seen in room TR11C/TR11C and the patient's care was started at 4:33 PM      Chief Complaint  Patient presents with  . Ankle Pain     (Consider location/radiation/quality/duration/timing/severity/associated sxs/prior Treatment) The history is provided by the patient. No language interpreter was used.   HPI Comments: Kelli Stephens is a 48 y.o. female who presents to the Emergency Department complaining of constant moderate right ankle pain onset yesterday after inverting foot following a mechanical fall. Describes pain as throbbing sensation. Reports associated swelling. Reports trying ibuprofen and elevation with mild relief of symptoms. Reports pain is exacerbated by weight bearing, touch, and movement. Denies associated fever, LOC, head injury, right knee pain, and other injuries.   Past Medical History  Diagnosis Date  . Gallstones   . Myasthenia gravis   . PVC (premature ventricular contraction)   . Ectopic pregnancy    Past Surgical History  Procedure Laterality Date  . Cesarean section    . Oophorectomy Left   . Cholecystectomy N/A 03/22/2012    Procedure: LAPAROSCOPIC CHOLECYSTECTOMY;  Surgeon: Cherylynn RidgesJames O Wyatt, MD;  [facility];  Service: General;  Laterality: N/A;   Family History  Problem Relation Age of Onset  . Cancer Mother   . Hypertension Mother   . Hypertension Father   . Arthritis Father   . Cancer Sister   . Heart disease Sister   . Crohn's disease Sister   . Down syndrome Brother   . Arthritis Sister   . Heart disease Sister   . Psoriasis Sister    History  Substance Use Topics  . Smoking status: Never Smoker   . Smokeless tobacco: Never Used  . Alcohol Use: Yes   Comment: rarely; 1 glass of wine a month    OB History   Grav Para Term Preterm Abortions TAB SAB Ect Mult Living                 Review of Systems  Musculoskeletal: Positive for arthralgias, joint swelling and myalgias.       Right ankle  Skin: Negative for color change and wound.  All other systems reviewed and are negative.     Allergies  Review of patient's allergies indicates no known allergies.  Home Medications   Prior to Admission medications   Medication Sig Start Date End Date Taking? Authorizing Provider  Multiple Vitamins-Minerals (MULTIVITAL PO) Take by mouth daily.    Historical Provider, MD   BP 106/66  Pulse 71  Temp(Src) 98.6 F (37 C) (Oral)  Resp 16  SpO2 100% Physical Exam  Nursing note and vitals reviewed. Constitutional: She is oriented to person, place, and time. She appears well-developed and well-nourished. No distress.  HENT:  Head: Normocephalic and atraumatic.  Eyes: EOM are normal.  Neck: Neck supple. No tracheal deviation present.  Cardiovascular: Normal rate.   Pulmonary/Chest: Effort normal. No respiratory distress.  Musculoskeletal:       Right ankle: She exhibits decreased range of motion and swelling. She exhibits no ecchymosis and no deformity. Tenderness. Lateral malleolus tenderness found. No head of 5th metatarsal and no proximal fibula tenderness found. Achilles tendon normal.  4/5 dorsiflexion and plantar flexion compared to  the left foot. Moderate edema to lateral malleolus. Pedal pulses 2+. Skin intact. No ecchymosis or erythema. Decreased ROM of right ankle secondary to pain. No calf tenderness, and no right knee tenderness. Sensation intact.   Neurological: She is alert and oriented to person, place, and time.  Skin: Skin is warm and dry.  Psychiatric: She has a normal mood and affect. Her behavior is normal.    ED Course  Procedures (including critical care time) Labs Review Labs Reviewed - No data to display  Imaging  Review Dg Ankle Complete Right  05/30/2013   CLINICAL DATA:  Twisted right ankle.  EXAM: RIGHT ANKLE - COMPLETE 3+ VIEW  COMPARISON:  None.  FINDINGS: The ankle mortise is maintained. No acute fracture or osteochondral lesion. Rounded density near the distal tip of the lateral malleolus is likely an old avulsion injury or unfused secondary ossification center. A small ankle joint effusion is suspected.  IMPRESSION: No acute ankle fracture.  Suspect ankle joint effusion.   Electronically Signed   By: Loralie ChampagneMark  Gallerani M.D.   On: 05/30/2013 14:29   Dg Foot Complete Right  05/30/2013   CLINICAL DATA:  Twisting injury.  EXAM: RIGHT FOOT COMPLETE - 3+ VIEW  COMPARISON:  None.  FINDINGS: Be joint spaces are maintained.  No acute fractures identified.  IMPRESSION: No acute bony findings.   Electronically Signed   By: Loralie ChampagneMark  Gallerani M.D.   On: 05/30/2013 14:28     EKG Interpretation None      MDM   Final diagnoses:  Right ankle sprain  Fall from slip, trip, or stumble    Patient complaining of right ankle pain and swelling. No evidence of fracture. Will treat as sprained. Advise followup with PCP in orthopedics as needed for continued symptoms. Return precautions provided. Pt verbalized understanding and agreement with tx plan.   I personally performed the services described in this documentation, which was scribed in my presence. The recorded information has been reviewed and is accurate.    Junius Finnerrin O'Malley, PA-C 05/30/13 [phone]

## 2013-05-30 NOTE — ED Notes (Signed)
Pt presents with Right ankle pain due to a fall yesterday, states she rolled her Right ankle yesterday causing swelling and bruising, pt reports increase pain when walking

## 2013-05-30 NOTE — Discharge Instructions (Signed)
Acute Ankle Sprain  with Phase I Rehab  An acute ankle sprain is a partial or complete tear in one or more of the ligaments of the ankle due to traumatic injury. The severity of the injury depends on both the the number of ligaments sprained and the grade of sprain. There are 3 grades of sprains.   · A grade 1 sprain is a mild sprain. There is a slight pull without obvious tearing. There is no loss of strength, and the muscle and ligament are the correct length.  · A grade 2 sprain is a moderate sprain. There is tearing of fibers within the substance of the ligament where it connects two bones or two cartilages. The length of the ligament is increased, and there is usually decreased strength.  · A grade 3 sprain is a complete rupture of the ligament and is uncommon.  In addition to the grade of sprain, there are three types of ankle sprains.   Lateral ankle sprains: This is a sprain of one or more of the three ligaments on the outer side (lateral) of the ankle. These are the most common sprains.  Medial ankle sprains: There is one large triangular ligament of the inner side (medial) of the ankle that is susceptible to injury. Medial ankle sprains are less common.  Syndesmosis, "high ankle," sprains: The syndesmosis is the ligament that connects the two bones of the lower leg. Syndesmosis sprains usually only occur with very severe ankle sprains.  SYMPTOMS  · Pain, tenderness, and swelling in the ankle, starting at the side of injury that may progress to the whole ankle and foot with time.  · "Pop" or tearing sensation at the time of injury.  · Bruising that may spread to the heel.  · Impaired ability to walk soon after injury.  CAUSES   · Acute ankle sprains are caused by trauma placed on the ankle that temporarily forces or pries the anklebone (talus) out of its normal socket.  · Stretching or tearing of the ligaments that normally hold the joint in place (usually due to a twisting injury).  RISK INCREASES  WITH:  · Previous ankle sprain.  · Sports in which the foot may land awkwardly (ie. basketball, volleyball, or soccer) or walking or running on uneven or rough surfaces.  · Shoes with inadequate support to prevent sideways motion when stress occurs.  · Poor strength and flexibility.  · Poor balance skills.  · Contact sports.  PREVENTION   · Warm up and stretch properly before activity.  · Maintain physical fitness:  · Ankle and leg flexibility, muscle strength, and endurance.  · Cardiovascular fitness.  · Balance training activities.  · Use proper technique and have a coach correct improper technique.  · Taping, protective strapping, bracing, or high-top tennis shoes may help prevent injury. Initially, tape is best; however, it loses most of its support function within 10 to 15 minutes.  · Wear proper fitted protective shoes (High-top shoes with taping or bracing is more effective than either alone).  · Provide the ankle with support during sports and practice activities for 12 months following injury.  PROGNOSIS   · If treated properly, ankle sprains can be expected to recover completely; however, the length of recovery depends on the degree of injury.  · A grade 1 sprain usually heals enough in 5 to 7 days to allow modified activity and requires an average of 6 weeks to heal completely.  · A grade 2 sprain requires   6 to 10 weeks to heal completely.  · A grade 3 sprain requires 12 to 16 weeks to heal.  · A syndesmosis sprain often takes more than 3 months to heal.  RELATED COMPLICATIONS   · Frequent recurrence of symptoms may result in a chronic problem. Appropriately addressing the problem the first time decreases the frequency of recurrence and optimizes healing time. Severity of the initial sprain does not predict the likelihood of later instability.  · Injury to other structures (bone, cartilage, or tendon).  · A chronically unstable or arthritic ankle joint is a possiblity with repeated  sprains.  TREATMENT  Treatment initially involves the use of ice, medication, and compression bandages to help reduce pain and inflammation. Ankle sprains are usually immobilized in a walking cast or boot to allow for healing. Crutches may be recommended to reduce pressure on the injury. After immobilization, strengthening and stretching exercises may be necessary to regain strength and a full range of motion. Surgery is rarely needed to treat ankle sprains.  MEDICATION   · Nonsteroidal anti-inflammatory medications, such as aspirin and ibuprofen (do not take for the first 3 days after injury or within 7 days before surgery), or other minor pain relievers, such as acetaminophen, are often recommended. Take these as directed by your caregiver. Contact your caregiver immediately if any bleeding, stomach upset, or signs of an allergic reaction occur from these medications.  · Ointments applied to the skin may be helpful.  · Pain relievers may be prescribed as necessary by your caregiver. Do not take prescription pain medication for longer than 4 to 7 days. Use only as directed and only as much as you need.  HEAT AND COLD  · Cold treatment (icing) is used to relieve pain and reduce inflammation for acute and chronic cases. Cold should be applied for 10 to 15 minutes every 2 to 3 hours for inflammation and pain and immediately after any activity that aggravates your symptoms. Use ice packs or an ice massage.  · Heat treatment may be used before performing stretching and strengthening activities prescribed by your caregiver. Use a heat pack or a warm soak.  SEEK IMMEDIATE MEDICAL CARE IF:   · Pain, swelling, or bruising worsens despite treatment.  · You experience pain, numbness, discoloration, or coldness in the foot or toes.  · New, unexplained symptoms develop (drugs used in treatment may produce side effects.)  EXERCISES   PHASE I EXERCISES  RANGE OF MOTION (ROM) AND STRETCHING EXERCISES - Ankle Sprain, Acute Phase I,  Weeks 1 to 2  These exercises may help you when beginning to restore flexibility in your ankle. You will likely work on these exercises for the 1 to 2 weeks after your injury. Once your physician, physical therapist, or athletic trainer sees adequate progress, he or she will advance your exercises. While completing these exercises, remember:   · Restoring tissue flexibility helps normal motion to return to the joints. This allows healthier, less painful movement and activity.  · An effective stretch should be held for at least 30 seconds.  · A stretch should never be painful. You should only feel a gentle lengthening or release in the stretched tissue.  RANGE OF MOTION - Dorsi/Plantar Flexion  · While sitting with your right / left knee straight, draw the top of your foot upwards by flexing your ankle. Then reverse the motion, pointing your toes downward.  · Hold each position for __________ seconds.  · After completing your first set of   exercises, repeat this exercise with your knee bent.  Repeat __________ times. Complete this exercise __________ times per day.   RANGE OF MOTION - Ankle Alphabet  · Imagine your right / left big toe is a pen.  · Keeping your hip and knee still, write out the entire alphabet with your "pen." Make the letters as large as you can without increasing any discomfort.  Repeat __________ times. Complete this exercise __________ times per day.   STRENGTHENING EXERCISES - Ankle Sprain, Acute -Phase I, Weeks 1 to 2  These exercises may help you when beginning to restore strength in your ankle. You will likely work on these exercises for 1 to 2 weeks after your injury. Once your physician, physical therapist, or athletic trainer sees adequate progress, he or she will advance your exercises. While completing these exercises, remember:   · Muscles can gain both the endurance and the strength needed for everyday activities through controlled exercises.  · Complete these exercises as instructed by  your physician, physical therapist, or athletic trainer. Progress the resistance and repetitions only as guided.  · You may experience muscle soreness or fatigue, but the pain or discomfort you are trying to eliminate should never worsen during these exercises. If this pain does worsen, stop and make certain you are following the directions exactly. If the pain is still present after adjustments, discontinue the exercise until you can discuss the trouble with your clinician.  STRENGTH - Dorsiflexors  · Secure a rubber exercise band/tubing to a fixed object (ie. table, pole) and loop the other end around your right / left foot.  · Sit on the floor facing the fixed object. The band/tubing should be slightly tense when your foot is relaxed.  · Slowly draw your foot back toward you using your ankle and toes.  · Hold this position for __________ seconds. Slowly release the tension in the band and return your foot to the starting position.  Repeat __________ times. Complete this exercise __________ times per day.   STRENGTH - Plantar-flexors   · Sit with your right / left leg extended. Holding onto both ends of a rubber exercise band/tubing, loop it around the ball of your foot. Keep a slight tension in the band.  · Slowly push your toes away from you, pointing them downward.  · Hold this position for __________ seconds. Return slowly, controlling the tension in the band/tubing.  Repeat __________ times. Complete this exercise __________ times per day.   STRENGTH - Ankle Eversion  · Secure one end of a rubber exercise band/tubing to a fixed object (table, pole). Loop the other end around your foot just before your toes.  · Place your fists between your knees. This will focus your strengthening at your ankle.  · Drawing the band/tubing across your opposite foot, slowly, pull your little toe out and up. Make sure the band/tubing is positioned to resist the entire motion.  · Hold this position for __________ seconds.  Have  your muscles resist the band/tubing as it slowly pulls your foot back to the starting position.   Repeat __________ times. Complete this exercise __________ times per day.   STRENGTH - Ankle Inversion  · Secure one end of a rubber exercise band/tubing to a fixed object (table, pole). Loop the other end around your foot just before your toes.  · Place your fists between your knees. This will focus your strengthening at your ankle.  · Slowly, pull your big toe up and in, making   sure the band/tubing is positioned to resist the entire motion.  · Hold this position for __________ seconds.  · Have your muscles resist the band/tubing as it slowly pulls your foot back to the starting position.  Repeat __________ times. Complete this exercises __________ times per day.   STRENGTH - Towel Curls  · Sit in a chair positioned on a non-carpeted surface.  · Place your right / left foot on a towel, keeping your heel on the floor.  · Pull the towel toward your heel by only curling your toes. Keep your heel on the floor.  · If instructed by your physician, physical therapist, or athletic trainer, add weight to the end of the towel.  Repeat __________ times. Complete this exercise __________ times per day.  Document Released: 08/13/2004 Document Revised: 04/06/2011 Document Reviewed: 04/26/2008  [employer]® Patient Information ©2014 [employer].

## 2013-06-02 NOTE — ED Provider Notes (Signed)
Medical screening examination/treatment/procedure(s) were performed by non-physician practitioner and as supervising physician I was immediately available for consultation/collaboration.   EKG Interpretation None         EKG Interpretation None        Rolland PorterMark Elijan Googe, MD 06/02/13 2338

## 2013-08-30 ENCOUNTER — Other Ambulatory Visit: Payer: Self-pay | Admitting: Physician Assistant

## 2013-08-30 ENCOUNTER — Other Ambulatory Visit (HOSPITAL_COMMUNITY)
Admission: RE | Admit: 2013-08-30 | Discharge: 2013-08-30 | Disposition: A | Discharge status: Home / Self Care | Payer: 59 | Source: Ambulatory Visit | Attending: Physician Assistant | Admitting: Physician Assistant

## 2013-08-30 DIAGNOSIS — Z124 Encounter for screening for malignant neoplasm of cervix: Secondary | ICD-10-CM | POA: Insufficient documentation

## 2013-08-30 DIAGNOSIS — Z1151 Encounter for screening for human papillomavirus (HPV): Secondary | ICD-10-CM | POA: Insufficient documentation

## 2013-09-01 LAB — CYTOLOGY - PAP

## 2014-04-10 ENCOUNTER — Emergency Department (HOSPITAL_COMMUNITY): Payer: 59

## 2014-04-10 ENCOUNTER — Encounter (HOSPITAL_COMMUNITY): Payer: Self-pay | Admitting: Emergency Medicine

## 2014-04-10 ENCOUNTER — Emergency Department (HOSPITAL_COMMUNITY)
Admission: EM | Admit: 2014-04-10 | Discharge: 2014-04-11 | Disposition: A | Discharge status: Home / Self Care | Payer: 59 | Attending: Emergency Medicine | Admitting: Emergency Medicine

## 2014-04-10 DIAGNOSIS — R079 Chest pain, unspecified: Secondary | ICD-10-CM | POA: Insufficient documentation

## 2014-04-10 DIAGNOSIS — R05 Cough: Secondary | ICD-10-CM | POA: Insufficient documentation

## 2014-04-10 DIAGNOSIS — Z8719 Personal history of other diseases of the digestive system: Secondary | ICD-10-CM | POA: Insufficient documentation

## 2014-04-10 DIAGNOSIS — Z8679 Personal history of other diseases of the circulatory system: Secondary | ICD-10-CM | POA: Insufficient documentation

## 2014-04-10 DIAGNOSIS — R531 Weakness: Secondary | ICD-10-CM | POA: Diagnosis not present

## 2014-04-10 DIAGNOSIS — R0602 Shortness of breath: Secondary | ICD-10-CM | POA: Diagnosis not present

## 2014-04-10 DIAGNOSIS — Z8669 Personal history of other diseases of the nervous system and sense organs: Secondary | ICD-10-CM | POA: Insufficient documentation

## 2014-04-10 LAB — I-STAT CHEM 8, ED
BUN: 16 mg/dL (ref 6–23)
CREATININE: 0.7 mg/dL (ref 0.50–1.10)
Calcium, Ion: 1.23 mmol/L (ref 1.12–1.23)
Chloride: 102 mmol/L (ref 96–112)
GLUCOSE: 95 mg/dL (ref 70–99)
HCT: 42 % (ref 36.0–46.0)
HEMOGLOBIN: 14.3 g/dL (ref 12.0–15.0)
Potassium: 3.8 mmol/L (ref 3.5–5.1)
Sodium: 141 mmol/L (ref 135–145)
TCO2: 23 mmol/L (ref 0–100)

## 2014-04-10 LAB — CBC WITH DIFFERENTIAL/PLATELET
BASOS ABS: 0 10*3/uL (ref 0.0–0.1)
BASOS PCT: 0 % (ref 0–1)
EOS ABS: 0.1 10*3/uL (ref 0.0–0.7)
EOS PCT: 1 % (ref 0–5)
HCT: 40.5 % (ref 36.0–46.0)
Hemoglobin: 13.4 g/dL (ref 12.0–15.0)
LYMPHS ABS: 2.3 10*3/uL (ref 0.7–4.0)
Lymphocytes Relative: 32 % (ref 12–46)
MCH: 31.2 pg (ref 26.0–34.0)
MCHC: 33.1 g/dL (ref 30.0–36.0)
MCV: 94.4 fL (ref 78.0–100.0)
Monocytes Absolute: 0.5 10*3/uL (ref 0.1–1.0)
Monocytes Relative: 6 % (ref 3–12)
Neutro Abs: 4.3 10*3/uL (ref 1.7–7.7)
Neutrophils Relative %: 61 % (ref 43–77)
Platelets: 221 10*3/uL (ref 150–400)
RBC: 4.29 MIL/uL (ref 3.87–5.11)
RDW: 13.5 % (ref 11.5–15.5)
WBC: 7.2 10*3/uL (ref 4.0–10.5)

## 2014-04-10 LAB — I-STAT TROPONIN, ED: Troponin i, poc: 0 ng/mL (ref 0.00–0.08)

## 2014-04-10 LAB — D-DIMER, QUANTITATIVE (NOT AT ARMC): D DIMER QUANT: 0.4 ug{FEU}/mL (ref 0.00–0.48)

## 2014-04-10 MED ORDER — MORPHINE SULFATE 4 MG/ML IJ SOLN
4.0000 mg | Freq: Once | INTRAMUSCULAR | Status: DC
Start: 2014-04-10 — End: 2014-04-11
  Filled 2014-04-10: qty 1

## 2014-04-10 NOTE — ED Provider Notes (Signed)
CSN: [id number]     Arrival date & time 04/10/14  2057 History   First MD Initiated Contact with Patient 04/10/14 2102     No chief complaint on file.    (Consider location/radiation/quality/duration/timing/severity/associated sxs/prior Treatment) HPI Comments: She was driving home from work this evening when she had a sudden onset of left back pain that radiated to her chest, shoulder and neck causing her to become diaphoretic and slightly short of breath.  Denies any nausea, previous history of same pain she states she marginally better, so went to her primary care walk-in clinic.  He will probably refer her to the emergency department.  She was transported by EMS after an IV was started, 1 325 mg aspirin and one nitroglycerin was given.  This did not change her pain which she describes as pressure.  He states she still has some discomfort in her chest and jaw and her left arm feels weak She denies any recent travel, history of PEs or DVTs.  No recent illnesses, no recent surgeries.  She states she has a history of seasonal allergies but has not had to start taking medication for this as of yet. He [facility] 2-3 years ago for some ventricular arrhythmias.  This was attributed to early menopause.  Patient denies taking any hormone replacement therapy.  She did have a cholecystectomy in 2014  The history is provided by the patient.    Past Medical History  Diagnosis Date  . Gallstones   . Myasthenia gravis   . PVC (premature ventricular contraction)   . Ectopic pregnancy    Past Surgical History  Procedure Laterality Date  . Cesarean section    . Oophorectomy Left   . Cholecystectomy N/A 03/22/2012    Procedure: LAPAROSCOPIC CHOLECYSTECTOMY;  Surgeon: Cherylynn RidgesJames O Wyatt, MD;  [facility];  Service: General;  Laterality: N/A;   Family History  Problem Relation Age of Onset  . Cancer Mother   . Hypertension Mother   . Hypertension Father   . Arthritis  Father   . Cancer Sister   . Heart disease Sister   . Crohn's disease Sister   . Down syndrome Brother   . Arthritis Sister   . Heart disease Sister   . Psoriasis Sister    History  Substance Use Topics  . Smoking status: Never Smoker   . Smokeless tobacco: Never Used  . Alcohol Use: Yes     Comment: rarely; 1 glass of wine a month    OB History    No data available     Review of Systems  Constitutional: Negative for fever.  HENT: Negative for rhinorrhea.   Respiratory: Positive for cough and shortness of breath. Negative for wheezing.   Gastrointestinal: Negative for nausea and abdominal pain.  Neurological: Positive for weakness. Negative for numbness.  All other systems reviewed and are negative.     Allergies  Review of patient's allergies indicates no known allergies.  Home Medications   Prior to Admission medications   Not on File   BP 109/66 mmHg  Pulse 73  Resp 20  SpO2 98% Physical Exam  Constitutional: She is oriented to person, place, and time. She appears well-developed and well-nourished.  HENT:  Head: Normocephalic.  Right Ear: External ear normal.  Left Ear: External ear normal.  Eyes: Pupils are equal, round, and reactive to light.  Neck: Normal range of motion.  Cardiovascular: Normal rate.   Pulmonary/Chest: Effort normal.  Abdominal:  Soft. She exhibits no distension. There is no tenderness.  Musculoskeletal: She exhibits no edema.  Neurological: She is alert and oriented to person, place, and time.  Skin: Skin is warm and dry.  Nursing note and vitals reviewed.   ED Course  Procedures (including critical care time) Labs Review Labs Reviewed  CBC WITH DIFFERENTIAL/PLATELET  D-DIMER, QUANTITATIVE  LIPASE, BLOOD  I-STAT CHEM 8, ED  I-STAT TROPOININ, ED  I-STAT TROPOININ, ED  Rosezena Sensor, ED    Imaging Review Dg Chest 2 View  04/10/2014   CLINICAL DATA:  Chest pain and shortness of breath.  EXAM: CHEST  2 VIEW  COMPARISON:   03/22/2012  FINDINGS: Both lungs are clear. Heart and mediastinum are within normal limits. The trachea is midline. Negative for pleural effusions. No acute bone abnormality.  IMPRESSION: No active cardiopulmonary disease.   Electronically Signed   By: Richarda Overlie M.D.   On: 04/10/2014 22:15     EKG Interpretation   Date/Time:  Tuesday April 10 2014 21:03:42 EDT Ventricular Rate:  74 PR Interval:  170 QRS Duration: 84 QT Interval:  393 QTC Calculation: 436 R Axis:   84 Text Interpretation:  Sinus rhythm No significant change since last  tracing Confirmed by Karma Ganja  MD, MARTHA [phone]) on 04/10/2014 9:10:02 PM     Heart score of 2 I spoke with Dr. Anselm Jungling wanted reviewed patient's labs, EKG, chest x-ray symptoms.  He agrees to meet her in the office first thing in the morning to arrange for a stress test MDM   Final diagnoses:  Chest pain         Earley Favor, NP 04/11/14 0214  Jerelyn Scott, MD 04/11/14 [phone]

## 2014-04-10 NOTE — ED Notes (Signed)
Pt sts that when she was in the ambulance she became dizzy and ems told her that her HR and BP decreased.

## 2014-04-10 NOTE — ED Notes (Signed)
Per ems-- pt from pcp. Pt having sudden onset of chest heaviness radiating down L arm and neck onset 2 hours ago. Pt was diaphoretic initially. vs stable. 20 G R fa. 324 asa, 1 nitro administered pta. No change in pain. Pain increases with deep breathing.

## 2014-04-11 ENCOUNTER — Other Ambulatory Visit (HOSPITAL_COMMUNITY): Payer: Self-pay | Admitting: Cardiology

## 2014-04-11 DIAGNOSIS — R079 Chest pain, unspecified: Secondary | ICD-10-CM

## 2014-04-11 LAB — LIPASE, BLOOD: LIPASE: 28 U/L (ref 11–59)

## 2014-04-11 LAB — I-STAT TROPONIN, ED: TROPONIN I, POC: 0 ng/mL (ref 0.00–0.08)

## 2014-04-11 NOTE — Discharge Instructions (Signed)
Chest Pain (Nonspecific) It is often hard to give a diagnosis for the cause of chest pain. There is always a chance that your pain could be related to something serious, such as a heart attack or a blood clot in the lungs. You need to follow up with your doctor. HOME CARE  If antibiotic medicine was given, take it as directed by your doctor. Finish the medicine even if you start to feel better.  For the next few days, avoid activities that bring on chest pain. Continue physical activities as told by your doctor.  Do not use any tobacco products. This includes cigarettes, chewing tobacco, and e-cigarettes.  Avoid drinking alcohol.  Only take medicine as told by your doctor.  Follow your doctor's suggestions for more testing if your chest pain does not go away.  Keep all doctor visits you made. GET HELP IF:  Your chest pain does not go away, even after treatment.  You have a rash with blisters on your chest.  You have a fever. GET HELP RIGHT AWAY IF:   You have more pain or pain that spreads to your arm, neck, jaw, back, or belly (abdomen).  You have shortness of breath.  You cough more than usual or cough up blood.  You have very bad back or belly pain.  You feel sick to your stomach (nauseous) or throw up (vomit).  You have very bad weakness.  You pass out (faint).  You have chills. This is an emergency. Do not wait to see if the problems will go away. Call your local emergency services (911 in [place] Do not drive yourself to the hospital. MAKE SURE YOU:   Understand these instructions.  Will watch your condition.  Will get help right away if you are not doing well or get worse. Document Released: 07/01/2007 Document Revised: 01/17/2013 Document Reviewed: 07/01/2007 [facility] Patient Information 2015 [place], [place]. This information is not intended to replace advice given to you by your health care provider. Make sure you discuss any questions you have with your  health care provider. Please call Dr. Nash DimmerWong's office first thing in the morning.  He has agreed to see you and further evaluate.  Your chest pain tonight.  You've had 2 sets of negative cardiac markers, which is reassuring.  The rest of your labs have also been normal

## 2014-04-11 NOTE — ED Notes (Signed)
Pt a/o x 4 on d/c with family. 

## 2014-04-25 ENCOUNTER — Encounter (HOSPITAL_COMMUNITY): Admission: RE | Admit: 2014-04-25 | Payer: 59 | Source: Ambulatory Visit

## 2014-04-25 ENCOUNTER — Encounter (HOSPITAL_COMMUNITY): Payer: 59

## 2015-03-24 ENCOUNTER — Encounter (HOSPITAL_COMMUNITY): Payer: Self-pay | Admitting: Emergency Medicine

## 2015-03-24 ENCOUNTER — Emergency Department (HOSPITAL_COMMUNITY): Payer: 59

## 2015-03-24 ENCOUNTER — Emergency Department (HOSPITAL_COMMUNITY)
Admission: EM | Admit: 2015-03-24 | Discharge: 2015-03-24 | Disposition: A | Discharge status: Home / Self Care | Payer: 59 | Attending: Emergency Medicine | Admitting: Emergency Medicine

## 2015-03-24 DIAGNOSIS — Z3202 Encounter for pregnancy test, result negative: Secondary | ICD-10-CM | POA: Insufficient documentation

## 2015-03-24 DIAGNOSIS — Z8719 Personal history of other diseases of the digestive system: Secondary | ICD-10-CM | POA: Diagnosis not present

## 2015-03-24 DIAGNOSIS — R0602 Shortness of breath: Secondary | ICD-10-CM | POA: Diagnosis not present

## 2015-03-24 DIAGNOSIS — Z8669 Personal history of other diseases of the nervous system and sense organs: Secondary | ICD-10-CM | POA: Diagnosis not present

## 2015-03-24 DIAGNOSIS — M546 Pain in thoracic spine: Secondary | ICD-10-CM | POA: Insufficient documentation

## 2015-03-24 DIAGNOSIS — R109 Unspecified abdominal pain: Secondary | ICD-10-CM | POA: Insufficient documentation

## 2015-03-24 DIAGNOSIS — Z79899 Other long term (current) drug therapy: Secondary | ICD-10-CM | POA: Insufficient documentation

## 2015-03-24 LAB — COMPREHENSIVE METABOLIC PANEL
ALT: 25 U/L (ref 14–54)
AST: 20 U/L (ref 15–41)
Albumin: 3.7 g/dL (ref 3.5–5.0)
Alkaline Phosphatase: 65 U/L (ref 38–126)
Anion gap: 8 (ref 5–15)
BUN: 11 mg/dL (ref 6–20)
CHLORIDE: 108 mmol/L (ref 101–111)
CO2: 24 mmol/L (ref 22–32)
CREATININE: 0.75 mg/dL (ref 0.44–1.00)
Calcium: 9.4 mg/dL (ref 8.9–10.3)
Glucose, Bld: 104 mg/dL — ABNORMAL HIGH (ref 65–99)
POTASSIUM: 4.4 mmol/L (ref 3.5–5.1)
SODIUM: 140 mmol/L (ref 135–145)
Total Bilirubin: 0.6 mg/dL (ref 0.3–1.2)
Total Protein: 6.9 g/dL (ref 6.5–8.1)

## 2015-03-24 LAB — CBC
HCT: 41.5 % (ref 36.0–46.0)
Hemoglobin: 13.6 g/dL (ref 12.0–15.0)
MCH: 31.1 pg (ref 26.0–34.0)
MCHC: 32.8 g/dL (ref 30.0–36.0)
MCV: 94.7 fL (ref 78.0–100.0)
Platelets: 225 10*3/uL (ref 150–400)
RBC: 4.38 MIL/uL (ref 3.87–5.11)
RDW: 13.9 % (ref 11.5–15.5)
WBC: 5.1 10*3/uL (ref 4.0–10.5)

## 2015-03-24 LAB — URINALYSIS, ROUTINE W REFLEX MICROSCOPIC
Bilirubin Urine: NEGATIVE
Glucose, UA: NEGATIVE mg/dL
Hgb urine dipstick: NEGATIVE
Ketones, ur: NEGATIVE mg/dL
Leukocytes, UA: NEGATIVE
Nitrite: NEGATIVE
Protein, ur: NEGATIVE mg/dL
Specific Gravity, Urine: 1.008 (ref 1.005–1.030)
pH: 6.5 (ref 5.0–8.0)

## 2015-03-24 LAB — WET PREP, GENITAL
Sperm: NONE SEEN
Trich, Wet Prep: NONE SEEN
Yeast Wet Prep HPF POC: NONE SEEN

## 2015-03-24 LAB — I-STAT BETA HCG BLOOD, ED (MC, WL, AP ONLY)

## 2015-03-24 LAB — I-STAT TROPONIN, ED
Troponin i, poc: 0 ng/mL (ref 0.00–0.08)
Troponin i, poc: 0 ng/mL (ref 0.00–0.08)

## 2015-03-24 LAB — LIPASE, BLOOD: LIPASE: 27 U/L (ref 11–51)

## 2015-03-24 MED ORDER — HYDROCODONE-ACETAMINOPHEN 5-325 MG PO TABS: ORAL_TABLET | ORAL | Status: DC

## 2015-03-24 MED ORDER — IOHEXOL 350 MG/ML SOLN
100.0000 mL | Freq: Once | INTRAVENOUS | Status: AC | PRN
  Administered 2015-03-24: 100 mL via INTRAVENOUS

## 2015-03-24 MED ORDER — IBUPROFEN 400 MG PO TABS
400.0000 mg | ORAL_TABLET | Freq: Once | ORAL | Status: AC
  Administered 2015-03-24: 400 mg via ORAL
  Filled 2015-03-24: qty 1

## 2015-03-24 NOTE — ED Provider Notes (Signed)
CSN: [id number]     Arrival date & time 03/24/15  4696 History   First MD Initiated Contact with Patient 03/24/15 [phone]     Chief Complaint  Patient presents with  . Shortness of Breath  . Back Pain  . Abdominal Pain     (Consider location/radiation/quality/duration/timing/severity/associated sxs/prior Treatment) HPI  Blood pressure 136/79, pulse 79, temperature 97.4 F (36.3 C), temperature source Oral, resp. rate 20, height  (1.6 m), weight 82.101 kg, SpO2 97 %.  Kelli Stephens is a 50 y.o. female complaining of severe mid thoracic back pain starting this morning when she woke up at approximately 5:30, she woke up several hours before and felt some slight discomfort but wasn't as severe. Needs pain medication, No pain medication was taken prior to arrival patient states she likes use homeopathic medicine. She also has shortness of breath and abdominal discomfort with some pressure and distention but no abdominal pain. Patient denies weakness, numbness, anterior chest pain, change in bowel or bladder habits, fever, chills, history of drug use, history of cancer, calf pain, leg swelling, smoking, hypertension, hyperlipidemia, diabetes, cocaine or methamphetamine abuse. On review of systems patient notes that she's had bronchitis, had diarrhea when she was taking a Z-Pak several weeks ago. She has persistent dry cough.   Past Medical History  Diagnosis Date  . Gallstones   . Myasthenia gravis (HCC)   . PVC (premature ventricular contraction)   . Ectopic pregnancy    Past Surgical History  Procedure Laterality Date  . Cesarean section    . Oophorectomy Left   . Cholecystectomy N/A 03/22/2012    Procedure: LAPAROSCOPIC CHOLECYSTECTOMY;  Surgeon: Cherylynn Ridges, MD;  Location: [facility] OR;  Service: General;  Laterality: N/A;   Family History  Problem Relation Age of Onset  . Cancer Mother   . Hypertension Mother   . Hypertension Father   . Arthritis Father   . Cancer Sister   . Heart  disease Sister   . Crohn's disease Sister   . Down syndrome Brother   . Arthritis Sister   . Heart disease Sister   . Psoriasis Sister    Social History  Substance Use Topics  . Smoking status: Never Smoker   . Smokeless tobacco: Never Used  . Alcohol Use: Yes     Comment: rarely; 1 glass of wine a month    OB History    No data available     Review of Systems  10 systems reviewed and found to be negative, except as noted in the HPI.   Allergies  Review of patient's allergies indicates no known allergies.  Home Medications   Prior to Admission medications   Medication Sig Start Date End Date Taking? Authorizing Provider  cholecalciferol (VITAMIN D) 1000 units tablet Take 1,000 Units by mouth daily.   Yes Historical Provider, MD  Cranberry 200 MG CAPS Take 200 mg by mouth daily.   Yes Historical Provider, MD  Multiple Vitamin (MULTIVITAMIN WITH MINERALS) TABS tablet Take 1 tablet by mouth daily.   Yes Historical Provider, MD  Omega-3 Fatty Acids (FISH OIL) 1000 MG CAPS Take 1,000 mg by mouth daily.   Yes Historical Provider, MD  HYDROcodone-acetaminophen (NORCO/VICODIN) 5-325 MG tablet Take 1-2 tablets by mouth every 6 hours as needed for pain and/or cough. 03/24/15   Marthella Osorno, PA-C   BP 121/75 mmHg  Pulse 58  Temp(Src) 98.3 F (36.8 C) (Oral)  Resp 18  Ht  (1.6 m)  Wt  82.101 kg  BMI 32.07 kg/m2  SpO2 96% Physical Exam  Constitutional: She is oriented to person, place, and time. She appears well-developed and well-nourished. No distress.  HENT:  Head: Normocephalic.  Mouth/Throat: Oropharynx is clear and moist.  Eyes: Conjunctivae are normal.  Neck: Normal range of motion. No JVD present. No tracheal deviation present.  Cardiovascular: Normal rate, regular rhythm and intact distal pulses.   Radial pulse equal bilaterally  Pulmonary/Chest: Effort normal and breath sounds normal. No stridor. No respiratory distress. She has no wheezes. She has no rales.  She exhibits no tenderness.  Abdominal: Soft. She exhibits no distension and no mass. There is no tenderness. There is no rebound and no guarding.  Genitourinary:  Pelvic exam is chaperoned by RN Denny Peon: No rashes or lesions, no abnormal vaginal discharge, normal cervix, no masses or cervical motion or adnexal tenderness.  Musculoskeletal: Normal range of motion. She exhibits no edema or tenderness.       Back:  No calf asymmetry, superficial collaterals, palpable cords, edema, Homans sign negative bilaterally.    Neurological: She is alert and oriented to person, place, and time.  No point tenderness to percussion of lumbar spinal processes.  No TTP or paraspinal muscular spasm. Strength is 5 out of 5 to bilateral lower extremities at hip and knee; extensor hallucis longus 5 out of 5. Ankle strength 5 out of 5, no clonus, neurovascularly intact. No saddle anaesthesia. Patellar reflexes are 2+ bilaterally.    Ambulates with a coordinated gait, can walk on tiptoes and heels   Skin: Skin is warm. She is not diaphoretic.  Psychiatric: She has a normal mood and affect.  Nursing note and vitals reviewed.   ED Course  Procedures (including critical care time) Labs Review Labs Reviewed  WET PREP, GENITAL - Abnormal; Notable for the following:    Clue Cells Wet Prep HPF POC PRESENT (*)    WBC, Wet Prep HPF POC MANY (*)    All other components within normal limits  COMPREHENSIVE METABOLIC PANEL - Abnormal; Notable for the following:    Glucose, Bld 104 (*)    All other components within normal limits  URINALYSIS, ROUTINE W REFLEX MICROSCOPIC (NOT AT [facility]) - Abnormal; Notable for the following:    Color, Urine STRAW (*)    APPearance CLOUDY (*)    All other components within normal limits  CBC  LIPASE, BLOOD  I-STAT BETA HCG BLOOD, ED ([facility], [facility], [facility] ONLY)  I-STAT TROPOININ, ED  I-STAT TROPOININ, ED  GC/CHLAMYDIA PROBE AMP ([place]) NOT AT [facility]    Imaging Review Dg Chest 2  View  03/24/2015  CLINICAL DATA:  50 year old female with acute shortness of breath. EXAM: CHEST  2 VIEW COMPARISON:  04/10/2014 and prior radiographs FINDINGS: The cardiomediastinal silhouette is unremarkable. Mild chronic peribronchial thickening is unchanged. There is no evidence of focal airspace disease, pulmonary edema, suspicious pulmonary nodule/mass, pleural effusion, or pneumothorax. No acute bony abnormalities are identified. IMPRESSION: No evidence of acute cardiopulmonary disease. Mild chronic peribronchial thickening. Electronically Signed   By: Harmon Pier M.D.   On: 03/24/2015 07:48   Ct Angio Chest Aorta W/cm &/or Wo/cm  03/24/2015  CLINICAL DATA:  Patient with severe thoracic and abdominal pain. Shortness of breath. Abdominal distension. EXAM: CT ANGIOGRAPHY CHEST, ABDOMEN AND PELVIS TECHNIQUE: Multidetector CT imaging through the chest, abdomen and pelvis was performed using the standard protocol during bolus administration of intravenous contrast. Additionally multi detector CT imaging through the chest was obtained without  contrast. Multiplanar reconstructed images and MIPs were obtained and reviewed to evaluate the vascular anatomy. CONTRAST:  OMNIPAQUE IOHEXOL 350 MG/ML SOLN COMPARISON:  Ultrasound abdomen 03/22/2012 FINDINGS: CTA CHEST FINDINGS Mediastinum/Nodes: Noncontrast images through the chest demonstrate no peripheral high attenuation within the thoracic aorta to suggest acute intramural hematoma. Post contrast images demonstrate no evidence for acute thoracic aortic dissection given limitations of evaluation of the aortic root for motion artifact. The heart is normal in size. No pericardial effusion. Normal caliber main pulmonary artery. No evidence for central pulmonary embolism. Lungs/Pleura: Central airways are patent. Expiratory phase imaging. Bilateral ground-glass opacities within the dependent lower lobes, most compatible with atelectasis. No large area of pulmonary  consolidation. No pleural effusion or pneumothorax. Musculoskeletal: No aggressive or acute appearing osseous lesions. Review of the MIP images confirms the above findings. CTA ABDOMEN AND PELVIS FINDINGS Hepatobiliary: Liver is normal in size and contour. Gallbladder surgically absent. No intrahepatic or extrahepatic biliary ductal dilatation. Pancreas: Unremarkable Spleen: Unremarkable Adrenals/Urinary Tract: The adrenal glands are normal. Kidneys enhance symmetrically with contrast. No hydronephrosis. Urinary bladder is unremarkable. Small amount of excreted contrast within the urinary bladder. Stomach/Bowel: The appendix is normal. No abnormal bowel wall thickening or evidence for bowel obstruction. No free fluid or free intraperitoneal air. Small hiatal hernia. Vascular/Lymphatic: Normal caliber abdominal aorta. No evidence for acute abdominal aortic dissection. No retroperitoneal adenopathy. Other: The endometrium is prominent. There is a possible mass within the endometrium near the fundus measuring 12 mm (image 119; series power 3). There is prominent soft tissue within the region of the lower uterine segment/ cervix (image 276; series 501). Musculoskeletal: No aggressive or acute appearing osseous lesions. Review of the MIP images confirms the above findings. IMPRESSION: No evidence for acute thoracic or abdominal aortic dissection. No acute process identified within the chest, abdomen or pelvis. There is soft tissue fullness within the region of the lower uterine segment/cervical location which is nonspecific in etiology. Recommend correlation with direct visualization to exclude the possibility of cervical mass. Additionally there is suggestion of thickening of the endometrium with possible mass near the fundus. Recommend correlation with pelvic ultrasound in the non acute setting. Electronically Signed   By: Annia Belt M.D.   On: 03/24/2015 10:42   Ct Cta Abd/pel W/cm &/or W/o Cm  03/24/2015   CLINICAL DATA:  Patient with severe thoracic and abdominal pain. Shortness of breath. Abdominal distension. EXAM: CT ANGIOGRAPHY CHEST, ABDOMEN AND PELVIS TECHNIQUE: Multidetector CT imaging through the chest, abdomen and pelvis was performed using the standard protocol during bolus administration of intravenous contrast. Additionally multi detector CT imaging through the chest was obtained without contrast. Multiplanar reconstructed images and MIPs were obtained and reviewed to evaluate the vascular anatomy. CONTRAST:  OMNIPAQUE IOHEXOL 350 MG/ML SOLN COMPARISON:  Ultrasound abdomen 03/22/2012 FINDINGS: CTA CHEST FINDINGS Mediastinum/Nodes: Noncontrast images through the chest demonstrate no peripheral high attenuation within the thoracic aorta to suggest acute intramural hematoma. Post contrast images demonstrate no evidence for acute thoracic aortic dissection given limitations of evaluation of the aortic root for motion artifact. The heart is normal in size. No pericardial effusion. Normal caliber main pulmonary artery. No evidence for central pulmonary embolism. Lungs/Pleura: Central airways are patent. Expiratory phase imaging. Bilateral ground-glass opacities within the dependent lower lobes, most compatible with atelectasis. No large area of pulmonary consolidation. No pleural effusion or pneumothorax. Musculoskeletal: No aggressive or acute appearing osseous lesions. Review of the MIP images confirms the above findings. CTA ABDOMEN AND PELVIS  FINDINGS Hepatobiliary: Liver is normal in size and contour. Gallbladder surgically absent. No intrahepatic or extrahepatic biliary ductal dilatation. Pancreas: Unremarkable Spleen: Unremarkable Adrenals/Urinary Tract: The adrenal glands are normal. Kidneys enhance symmetrically with contrast. No hydronephrosis. Urinary bladder is unremarkable. Small amount of excreted contrast within the urinary bladder. Stomach/Bowel: The appendix is normal. No abnormal bowel  wall thickening or evidence for bowel obstruction. No free fluid or free intraperitoneal air. Small hiatal hernia. Vascular/Lymphatic: Normal caliber abdominal aorta. No evidence for acute abdominal aortic dissection. No retroperitoneal adenopathy. Other: The endometrium is prominent. There is a possible mass within the endometrium near the fundus measuring 12 mm (image 119; series power 3). There is prominent soft tissue within the region of the lower uterine segment/ cervix (image 276; series 501). Musculoskeletal: No aggressive or acute appearing osseous lesions. Review of the MIP images confirms the above findings. IMPRESSION: No evidence for acute thoracic or abdominal aortic dissection. No acute process identified within the chest, abdomen or pelvis. There is soft tissue fullness within the region of the lower uterine segment/cervical location which is nonspecific in etiology. Recommend correlation with direct visualization to exclude the possibility of cervical mass. Additionally there is suggestion of thickening of the endometrium with possible mass near the fundus. Recommend correlation with pelvic ultrasound in the non acute setting. Electronically Signed   By: Annia Belt M.D.   On: 03/24/2015 10:42   I have personally reviewed and evaluated these images and lab results as part of my medical decision-making.   EKG Interpretation   Date/Time:  Sunday March 24 2015 07:26:50 EST Ventricular Rate:  76 PR Interval:  160 QRS Duration: 84 QT Interval:  388 QTC Calculation: 436 R Axis:   82 Text Interpretation:  Normal sinus rhythm Normal ECG No significant change  since last tracing Confirmed by [facility] MD, ERIN ([id number]) on 03/24/2015  12:15:22 PM      MDM   Final diagnoses:  Bilateral thoracic back pain  Shortness of breath    Filed Vitals:   03/24/15 0830 03/24/15 0900 03/24/15 1030 03/24/15 1218  BP: 114/65 115/64 121/75   Pulse: 62 58 58   Temp:    98.3 F (36.8 C)    TempSrc:    Oral  Resp: Height:      Weight:      SpO2: 98% 97% 96%     Medications  iohexol (OMNIPAQUE) 350 MG/ML injection 100 mL (100 mLs Intravenous Contrast Given 03/24/15 0937)  ibuprofen (ADVIL,MOTRIN) tablet 400 mg (400 mg Oral Given 03/24/15 1121)    Kelli Stephens is 50 y.o. female presenting with severe thoracic back pain with associated shortness of breath and abdominal discomfort and distention. Declines pain medication. EKG nonischemic, urinalysis and blood work including troponin with no acute abnormalities. CT angiogram chest abdomen pelvis is negative for dissection or acute pathology however, there is the suggestion of soft tissue fullness in the lower ureter in her cervical area recommend direct visualization to rule out cervical mass. There is also thickening of endometrium with mask the fundus radiology recommends outpatient ultrasound. Pelvic exam performed with no abnormalities or masses appreciated: Wet prep shows clue cells however, she is not complaining of any abnormal vaginal discharge and didn't see any significant discharge or my exam. Patient will be given OB/GYN referral, advised her she will need to have an ultrasound to evaluate the abnormality seen in the uterus. Patient verbalized her understanding.  Repeat troponin is negative, this patient is  low risk by heart score. Over the course of her stay in the ED it appears that her pain is exacerbated by movement and position, she hasn't had any abnormal lifting or straining that might cause a muscle strain but that is a possible etiology. Patient will be advised of follicles with primary care and OB/GYN, will write her a prescription for some stronger pain medications and takes over-the-counter meds don't relieve her pain.  Evaluation does not show pathology that would require ongoing emergent intervention or inpatient treatment. Pt is hemodynamically stable and mentating appropriately. Discussed findings and  plan with patient/guardian, who agrees with care plan. All questions answered. Return precautions discussed and outpatient follow up given.   New Prescriptions   HYDROCODONE-ACETAMINOPHEN (NORCO/VICODIN) 5-325 MG TABLET    Take 1-2 tablets by mouth every 6 hours as needed for pain and/or cough.         Wynetta Emery, PA-C 03/24/15 1610  Alvira Monday, MD 03/25/15 1022

## 2015-03-24 NOTE — ED Notes (Signed)
Pt from home with c/o severe mid back pain starting this morning when she woke up.  Denies any injury or new activity to cause pain.  Pt reports SOB and pressure, unsure if the back pain and SOB are associated but they started today.  Reports abdominal distention/pressure as well.  NAD, ambulatory, A&O.

## 2015-03-24 NOTE — Discharge Instructions (Signed)
For pain control please take ibuprofen (also known as Motrin or Advil)  (this is normally 4 over the counter pills) 3 times a day  for 5 days. Take with food to minimize stomach irritation.  Take vicodin for breakthrough pain, do not drink alcohol, drive, care for children or do other critical tasks while taking vicodin.  It is very important that you take deep breaths to prevent lung collapse and infection.  Either use your incentive spirometer or take 10 deep breaths every hour to prevent lung collapse.  If you develop cough, fever or shortness of breath return immediately to the emergency room.   Please follow with your primary care doctor in the next 2 days for a check-up. They must obtain records for further management.   Do not hesitate to return to the Emergency Department for any new, worsening or concerning symptoms.

## 2015-03-25 LAB — GC/CHLAMYDIA PROBE AMP (~~LOC~~) NOT AT ARMC
CHLAMYDIA, DNA PROBE: NEGATIVE
NEISSERIA GONORRHEA: NEGATIVE

## 2015-04-03 ENCOUNTER — Other Ambulatory Visit: Payer: Self-pay | Admitting: Obstetrics & Gynecology

## 2015-05-06 ENCOUNTER — Encounter (HOSPITAL_COMMUNITY): Payer: Self-pay | Admitting: *Deleted

## 2015-05-23 NOTE — H&P (Signed)
[id number] postmenopausal who presents for preop visit- pt scheduled for hysteroscopy, D&C, polypectomy. In review, she was noted to have abdominal distension- where a 12mm mass within endometrium and prominent soft tissue in LUS.  Of note, she has had two episodes of bleeding- it is sporadic and usually last for a few days. At most, the bleeding required 3-4 pads per day. Last bleeding episode about 2 weeks ago. No pelvic pain. Denies vaginal discharge, itching or irritation.  Of note, both her mother and grandmother and uterine cancer. Denies h/o familial colon cancer.   EMB: Inadequate sample TVUS: 9cm anteverted uterus with thickened endometrium, hyperechoic area visualized with blood flow- measuring ~2.2cm in size- findings consistent with polyp.  Current Medications  Taking   Multivitamin . 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   PVC's on holter monitor.           Surgical History  Ectopic pregnancy, one tube removed 2008  C-Section 2000  cholecystectomy 03/22/2012   Family History  Father: alive 4280 yrs, diagnosed with HTN  Mother: deceased 1862 yrs, diagnosed with HTN  Paternal Grand Father: deceased  Paternal Grand Mother: deceased late 3450's yrs, diagnosed with DM, CVA  Maternal Grand Father: deceased  Maternal Grand Mother: deceased  Brother 1: alive 50 yrs  Brother2: alive 5042 yrs  Sister 1: alive 7457 yrs  Sister 2: alive 7654 yrs  Sister 3: alive 3559 yrs  4th sister alive and healthy,.   Social History  General:  Tobacco use  cigarettes: Never smoked Tobacco history last updated 05/23/2015 no EXPOSURE TO PASSIVE SMOKE.  Alcohol: yes, occasional wine.  Caffeine: yes, rare.  no Recreational drug  use.  Exercise: yes, minimal.  Marital Status: Separated.  Children: Yes, 3, son (s), 1, daughter (s) one son is not living with pt.  EDUCATION: yes, [employer] [place].  OCCUPATION: employed, [employer], Dietitianprogram manager.  Religion: Nondenominational christian.  Seat belt use: always.  Firearms at home: no.  no Tobacco Exposure.    Gyn History  Sexual activity currently sexually active.  Periods : irregular.  LMP Spotting on and off .  Birth control condoms.  Last pap smear date 08/2013 Normal.  Last mammogram date 09/13/2013 .  Abnormal pap smear none.    OB History  Pregnancy # 1 boy, vaginal delivery, preeclampsia.  Pregnancy # 2 live birth, vaginal delivery, boy.  Pregnancy # 3 miscarriage.  Pregnancy # 4: live birth, C-section, Twins, girl, boy.  Pregnancy # 5: Miscarriage @ 20 weeks .  Pregnancy # 6 Ectopic .    Allergies  N.K.D.A.   Hospitalization/Major Diagnostic Procedure  No Hospitalization History.   Review of Systems  CONSTITUTIONAL:  no Chills. no Fever. no Night sweats.  HEENT:  Blurrred vision no. no Double vision.  CARDIOLOGY:  no Chest pain.  UROLOGY:  no Urinary frequency. no Urinary incontinence. no Urinary urgency.  RESPIRATORY:  no Shortness of breath. no Cough.  GASTROENTEROLOGY:  no Abdominal pain. no Appetite change. no Change in bowel movements.  FEMALE REPRODUCTIVE:  no Breast lumps or discharge. no Breast pain.  NEUROLOGY:  no Dizziness. no Headache. no Loss  of consciousness.  PSYCHOLOGY:  no Depression. no Confusion.  SKIN:  no Rash. no Hives.  HEMATOLOGY/LYMPH:  no Anemia. no Fatigue. Using Blood Thinners no   O:  Examination performed in office: Vital Signs  Wt 181, Wt change 2 lb, Ht 63.25, BMI 31.81, Pulse sitting 74, BP sitting 124/72.   Examination  General Examination: GENERAL APPEARANCE well developed, well nourished .  SKIN: warm and dry, no rashes .  NECK: supple, normal appearance  .  LUNGS: regular breathing rate and effort .  HEART: no murmurs, regular rate and rhythm.  ABDOMEN: soft and not tender, no masses palpated, no rebound, no rigidity.  MUSCULOSKELETAL no calf tenderness bilaterally .  EXTREMITIES: no edema present .  PSYCH appropriate mood and affect .     A/P: 50yo postmenopausal female who presents for hysteroscopy, D&C, polypectomy due to postmenopausal bleeding and uterine polyp -NPO -LR @ 125cc/hr -antibiotics not indicated -SCDs to OR Risk/benefit and indications of procedure reviewed. Discussed recovery and expectations of hospital stay. Questions and concerns were addressed and pt wishes to proceed.   Myna Hidalgo, DO [phone] (pager) [phone] (office)

## 2015-05-30 ENCOUNTER — Encounter (HOSPITAL_COMMUNITY)
Admission: RE | Disposition: A | Discharge status: Home / Self Care | Payer: Self-pay | Source: Ambulatory Visit | Attending: Obstetrics & Gynecology

## 2015-05-30 ENCOUNTER — Encounter (HOSPITAL_COMMUNITY): Payer: Self-pay | Admitting: *Deleted

## 2015-05-30 ENCOUNTER — Ambulatory Visit (HOSPITAL_COMMUNITY): Payer: 59 | Admitting: Anesthesiology

## 2015-05-30 ENCOUNTER — Ambulatory Visit (HOSPITAL_COMMUNITY)
Admission: RE | Admit: 2015-05-30 | Discharge: 2015-05-30 | Disposition: A | Discharge status: Home / Self Care | Payer: 59 | Source: Ambulatory Visit | Attending: Obstetrics & Gynecology | Admitting: Obstetrics & Gynecology

## 2015-05-30 DIAGNOSIS — N950 Postmenopausal bleeding: Secondary | ICD-10-CM | POA: Insufficient documentation

## 2015-05-30 DIAGNOSIS — E669 Obesity, unspecified: Secondary | ICD-10-CM | POA: Insufficient documentation

## 2015-05-30 DIAGNOSIS — N840 Polyp of corpus uteri: Secondary | ICD-10-CM | POA: Insufficient documentation

## 2015-05-30 DIAGNOSIS — Z6832 Body mass index (BMI) 32.0-32.9, adult: Secondary | ICD-10-CM | POA: Insufficient documentation

## 2015-05-30 HISTORY — PX: DILATATION & CURETTAGE/HYSTEROSCOPY WITH MYOSURE: SHX6511

## 2015-05-30 HISTORY — DX: Unspecified osteoarthritis, unspecified site: M19.90

## 2015-05-30 LAB — CBC
HEMATOCRIT: 40.4 % (ref 36.0–46.0)
HEMOGLOBIN: 13.8 g/dL (ref 12.0–15.0)
MCH: 31.9 pg (ref 26.0–34.0)
MCHC: 34.2 g/dL (ref 30.0–36.0)
MCV: 93.3 fL (ref 78.0–100.0)
Platelets: 243 10*3/uL (ref 150–400)
RBC: 4.33 MIL/uL (ref 3.87–5.11)
RDW: 13.8 % (ref 11.5–15.5)
WBC: 5.7 10*3/uL (ref 4.0–10.5)

## 2015-05-30 LAB — COMPREHENSIVE METABOLIC PANEL
ALBUMIN: 4.4 g/dL (ref 3.5–5.0)
ALK PHOS: 69 U/L (ref 38–126)
ALT: 30 U/L (ref 14–54)
AST: 24 U/L (ref 15–41)
Anion gap: 6 (ref 5–15)
BILIRUBIN TOTAL: 1 mg/dL (ref 0.3–1.2)
BUN: 12 mg/dL (ref 6–20)
CALCIUM: 9.2 mg/dL (ref 8.9–10.3)
CO2: 26 mmol/L (ref 22–32)
Chloride: 106 mmol/L (ref 101–111)
Creatinine, Ser: 0.72 mg/dL (ref 0.44–1.00)
GFR calc Af Amer: >60 mL/min (ref >60)
GLUCOSE: 87 mg/dL (ref 65–99)
Potassium: 4.1 mmol/L (ref 3.5–5.1)
Sodium: 138 mmol/L (ref 135–145)
TOTAL PROTEIN: 7.8 g/dL (ref 6.5–8.1)

## 2015-05-30 LAB — TYPE AND SCREEN
ABO/RH(D): O POS
Antibody Screen: NEGATIVE

## 2015-05-30 SURGERY — DILATATION & CURETTAGE/HYSTEROSCOPY WITH MYOSURE
Anesthesia: General | Site: Vagina

## 2015-05-30 MED ORDER — LACTATED RINGERS IV SOLN
INTRAVENOUS | Status: DC
  Administered 2015-05-30: 14:00:00 via INTRAVENOUS

## 2015-05-30 MED ORDER — FENTANYL CITRATE (PF) 100 MCG/2ML IJ SOLN
INTRAMUSCULAR | Status: AC
  Filled 2015-05-30: qty 2

## 2015-05-30 MED ORDER — LIDOCAINE HCL (CARDIAC) 20 MG/ML IV SOLN
INTRAVENOUS | Status: AC
  Filled 2015-05-30: qty 5

## 2015-05-30 MED ORDER — ONDANSETRON HCL 4 MG/2ML IJ SOLN: 4.0000 mg | Freq: Once | INTRAMUSCULAR | Status: DC | PRN

## 2015-05-30 MED ORDER — FENTANYL CITRATE (PF) 100 MCG/2ML IJ SOLN
INTRAMUSCULAR | Status: DC | PRN
  Administered 2015-05-30 (×2): 25 ug via INTRAVENOUS
  Administered 2015-05-30: 50 ug via INTRAVENOUS

## 2015-05-30 MED ORDER — FENTANYL CITRATE (PF) 100 MCG/2ML IJ SOLN: 25.0000 ug | INTRAMUSCULAR | Status: DC | PRN

## 2015-05-30 MED ORDER — MIDAZOLAM HCL 2 MG/2ML IJ SOLN
INTRAMUSCULAR | Status: AC
  Filled 2015-05-30: qty 2

## 2015-05-30 MED ORDER — DEXAMETHASONE SODIUM PHOSPHATE 10 MG/ML IJ SOLN
INTRAMUSCULAR | Status: DC | PRN
  Administered 2015-05-30: 4 mg via INTRAVENOUS

## 2015-05-30 MED ORDER — KETOROLAC TROMETHAMINE 30 MG/ML IJ SOLN
INTRAMUSCULAR | Status: AC
  Filled 2015-05-30: qty 1

## 2015-05-30 MED ORDER — DEXAMETHASONE SODIUM PHOSPHATE 4 MG/ML IJ SOLN
INTRAMUSCULAR | Status: AC
  Filled 2015-05-30: qty 1

## 2015-05-30 MED ORDER — PROPOFOL 10 MG/ML IV BOLUS
INTRAVENOUS | Status: DC | PRN
  Administered 2015-05-30: 40 mg via INTRAVENOUS
  Administered 2015-05-30: 160 mg via INTRAVENOUS
  Administered 2015-05-30: 50 mg via INTRAVENOUS

## 2015-05-30 MED ORDER — KETOROLAC TROMETHAMINE 30 MG/ML IJ SOLN
INTRAMUSCULAR | Status: DC | PRN
  Administered 2015-05-30: 30 mg via INTRAVENOUS

## 2015-05-30 MED ORDER — LACTATED RINGERS IV SOLN
INTRAVENOUS | Status: DC
  Administered 2015-05-30 (×2): via INTRAVENOUS

## 2015-05-30 MED ORDER — SCOPOLAMINE 1 MG/3DAYS TD PT72
MEDICATED_PATCH | TRANSDERMAL | Status: AC
  Administered 2015-05-30: 1.5 mg via TRANSDERMAL
  Filled 2015-05-30: qty 1

## 2015-05-30 MED ORDER — LIDOCAINE HCL (CARDIAC) 20 MG/ML IV SOLN
INTRAVENOUS | Status: DC | PRN
  Administered 2015-05-30: 80 mg via INTRAVENOUS

## 2015-05-30 MED ORDER — MIDAZOLAM HCL 2 MG/2ML IJ SOLN
INTRAMUSCULAR | Status: DC | PRN
  Administered 2015-05-30: 2 mg via INTRAVENOUS

## 2015-05-30 MED ORDER — PROPOFOL 10 MG/ML IV BOLUS
INTRAVENOUS | Status: AC
  Filled 2015-05-30: qty 20

## 2015-05-30 MED ORDER — ONDANSETRON HCL 4 MG/2ML IJ SOLN
INTRAMUSCULAR | Status: DC | PRN
  Administered 2015-05-30: 4 mg via INTRAVENOUS

## 2015-05-30 MED ORDER — ONDANSETRON HCL 4 MG/2ML IJ SOLN
INTRAMUSCULAR | Status: AC
  Filled 2015-05-30: qty 2

## 2015-05-30 MED ORDER — SCOPOLAMINE 1 MG/3DAYS TD PT72
1.0000 | MEDICATED_PATCH | Freq: Once | TRANSDERMAL | Status: DC
  Administered 2015-05-30: 1.5 mg via TRANSDERMAL

## 2015-05-30 SURGICAL SUPPLY — 17 items
CANISTERS HI-FLOW 3000CC (CANNISTER) ×3 IMPLANT
CATH ROBINSON RED A/P 16FR (CATHETERS) ×3 IMPLANT
CLOTH BEACON ORANGE TIMEOUT ST (SAFETY) ×3 IMPLANT
CONTAINER PREFILL 10% NBF 60ML (FORM) ×6 IMPLANT
DEVICE MYOSURE LITE (MISCELLANEOUS) ×2 IMPLANT
DEVICE MYOSURE REACH (MISCELLANEOUS) IMPLANT
DILATOR CANAL MILEX (MISCELLANEOUS) IMPLANT
GLOVE BIOGEL PI IND STRL 6.5 (GLOVE) ×2 IMPLANT
GLOVE BIOGEL PI IND STRL 7.0 (GLOVE) ×1 IMPLANT
GLOVE BIOGEL PI INDICATOR 6.5 (GLOVE) ×4
GLOVE BIOGEL PI INDICATOR 7.0 (GLOVE) ×2
GLOVE ECLIPSE 6.5 STRL STRAW (GLOVE) ×3 IMPLANT
GOWN STRL REUS W/TWL LRG LVL3 (GOWN DISPOSABLE) ×6 IMPLANT
PACK VAGINAL MINOR WOMEN LF (CUSTOM PROCEDURE TRAY) ×3 IMPLANT
PAD OB MATERNITY 4.3X12.25 (PERSONAL CARE ITEMS) ×3 IMPLANT
SEAL ROD LENS SCOPE MYOSURE (ABLATOR) ×3 IMPLANT
TOWEL OR 17X24 6PK STRL BLUE (TOWEL DISPOSABLE) ×6 IMPLANT

## 2015-05-30 NOTE — Anesthesia Procedure Notes (Signed)
Procedure Name: LMA Insertion Date/Time: 05/30/2015 3:18 PM Performed by: Yolonda KidaARVER, Kelli Niazi L Pre-anesthesia Checklist: Patient identified, Emergency Drugs available, Suction available and Patient being monitored Patient Re-evaluated:Patient Re-evaluated prior to inductionOxygen Delivery Method: Circle system utilized Preoxygenation: Pre-oxygenation with 100% oxygen Intubation Type: IV induction Ventilation: Mask ventilation without difficulty LMA: LMA inserted LMA Size: 4.0 Number of attempts: 1 Placement Confirmation: CO2 detector,  positive ETCO2 and breath sounds checked- equal and bilateral Tube secured with: Tape Dental Injury: Teeth and Oropharynx as per pre-operative assessment

## 2015-05-30 NOTE — Discharge Instructions (Signed)
HOME INSTRUCTIONS  Please note any unusual or excessive bleeding, pain, swelling. Mild dizziness or drowsiness are normal for about 24 hours after surgery.   Shower when comfortable  Restrictions: No driving for 24 hours or while taking pain medications.  Activity:  No heavy lifting (> 10 lbs), nothing in vagina (no tampons, douching, or intercourse) x 1-2 weeks; no tub baths for 1-2 weeks Vaginal spotting is expected but if your bleeding is heavy, period like,  please call the office   Incision: the bandaids will fall off when they are ready to; you may clean your incision with mild soap and water but do not rub or scrub the incision site.  You may experience slight bloody drainage from your incision periodically.  This is normal.  If you experience a large amount of drainage or the incision opens, please call your physician who will likely direct you to the emergency department.  Diet:  You may return to your regular diet.  Do not eat large meals.  Eat small frequent meals throughout the day.  Continue to drink a good amount of water at least 6-8 glasses of water per day, hydration is very important for the healing process.  Pain Management: Take Motrin and/or Tylenol as prescribed/needed for pain.  Always take prescription pain medication with food, it may cause constipation, increase fluids and fiber and you may want to take an over-the-counter stool softener like Colace as needed up to 2x a day.    Alcohol -- Avoid for 24 hours and while taking pain medications.  Nausea: Take sips of ginger ale or soda  Fever -- Call physician if temperature over 101 degrees  Follow up:  If you do not already have a follow up appointment scheduled, please call the office at [phone].  If you experience fever (a temperature greater than 100.4), pain unrelieved by pain medication, shortness of breath, swelling of a single leg, or any other symptoms which are concerning to you please the office  immediately.

## 2015-05-30 NOTE — Anesthesia Postprocedure Evaluation (Signed)
Anesthesia Post Note  Patient: Kelli NamLisa M Catanzaro  Procedure(s) Performed: Procedure(s) (LRB): DILATATION & CURETTAGE/HYSTEROSCOPY WITH MYOSURE (N/A)  Patient location during evaluation: PACU Anesthesia Type: General Level of consciousness: awake and alert Pain management: pain level controlled Vital Signs Assessment: post-procedure vital signs reviewed and stable Respiratory status: spontaneous breathing, nonlabored ventilation, respiratory function stable and patient connected to nasal cannula oxygen Cardiovascular status: blood pressure returned to baseline and stable Postop Assessment: no signs of nausea or vomiting Anesthetic complications: no     Last Vitals:  Filed Vitals:   05/30/15 1615 05/30/15 1630  BP: 119/70 123/79  Pulse: 59 60  Temp:  36.5 C  Resp: 16 13    Last Pain: There were no vitals filed for this visit. Pain Goal:                 Jaaziel Peatross JENNETTE

## 2015-05-30 NOTE — Anesthesia Preprocedure Evaluation (Signed)
Anesthesia Evaluation  Patient identified by MRN, date of birth, ID band Patient awake    Reviewed: Allergy & Precautions, NPO status , Patient's Chart, lab work & pertinent test results  History of Anesthesia Complications Negative for: history of anesthetic complications  Airway Mallampati: II  TM Distance: >3 FB Neck ROM: Full    Dental no notable dental hx. (+) Dental Advisory Given   Pulmonary neg pulmonary ROS,    Pulmonary exam normal breath sounds clear to auscultation       Cardiovascular negative cardio ROS Normal cardiovascular exam Rhythm:Regular Rate:Normal     Neuro/Psych Patient reports that she does not and never has had treatment for myasthenia gravis. She was worked up for this but never given this diagnosis negative neurological ROS  negative psych ROS   GI/Hepatic negative GI ROS, Neg liver ROS,   Endo/Other  obesity  Renal/GU negative Renal ROS  negative genitourinary   Musculoskeletal  (+) Arthritis ,   Abdominal   Peds negative pediatric ROS (+)  Hematology negative hematology ROS (+)   Anesthesia Other Findings   Reproductive/Obstetrics negative OB ROS                             Anesthesia Physical Anesthesia Plan  ASA: II  Anesthesia Plan: General   Post-op Pain Management:    Induction: Intravenous  Airway Management Planned: LMA  Additional Equipment:   Intra-op Plan:   Post-operative Plan: Extubation in OR  Informed Consent: I have reviewed the patients History and Physical, chart, labs and discussed the procedure including the risks, benefits and alternatives for the proposed anesthesia with the patient or authorized representative who has indicated his/her understanding and acceptance.   Dental advisory given  Plan Discussed with: CRNA  Anesthesia Plan Comments:         Anesthesia Quick Evaluation

## 2015-05-30 NOTE — Op Note (Signed)
Operative Report  PreOp: postmenopausal bleeding, possible uterine polyp PostOp: PMB and uterine synechaie Procedure:  Hysteroscopy, Dilation and Curettage, uterine resection Surgeon: Dr. Myna HidalgoJennifer Veniamin Stephens Anesthesia: General Complications:none EBL: 5mL UOP: 10mL IVF:1100cc Discrepancy: 170cc  Findings:9cm anteverted uterus with proliferative endometrium  Specimens: 1) endometrial curettings  Procedure: The patient was taken to the operating room where she underwent general anesthesia without difficulty. The patient was placed in a low lithotomy position using Allen stirrups. She was then prepped and draped in the normal sterile fashion. The bladder was drained using a red rubber urethral catheter. A sterile speculum was inserted into the vagina. A single tooth tenaculum was placed on the anterior lip of the cervix. The uterus was then sounded to 9cm. The endocervical canal was then serially dilated to 14French using Hank dilators.  The diagnostic hysteroscope was then inserted without difficulty and noted to have the findings as listed above.  The myosure device was used for resection of the synechaie and proliferative endometrium.  The hysteroscope was removed and sharp curettage was performed. The tissue was sent to pathology. The hysteroscope was reinserted and no uterine perforation was seen. All instrument were then removed. Hemostasis was observed at the cervical site.  The patient was repositioned to the supine position. The patient tolerated the procedure without any complications and taken to recovery in stable condition.   Myna HidalgoJennifer [name], DO [phone] (pager) [phone] (office)

## 2015-05-30 NOTE — Transfer of Care (Signed)
Immediate Anesthesia Transfer of Care Note  Patient: Kelli Stephens  Procedure(s) Performed: Procedure(s) with comments: DILATATION & CURETTAGE/HYSTEROSCOPY WITH MYOSURE (N/A) - Polypectomy  Patient Location: PACU  Anesthesia Type:General  Level of Consciousness: awake, alert , oriented and patient cooperative  Airway & Oxygen Therapy: Patient Spontanous Breathing and Patient connected to nasal cannula oxygen  Post-op Assessment: Report given to RN and Post -op Vital signs reviewed and stable  Post vital signs: Reviewed and stable  Last Vitals: There were no vitals filed for this visit.  Last Pain: There were no vitals filed for this visit.       Complications: No apparent anesthesia complications

## 2015-05-30 NOTE — Interval H&P Note (Signed)
History and Physical Interval Note:  05/30/2015 3:04 PM  Kelli NamLisa M Siegrist  has presented today for surgery, with the diagnosis of N84.0 Uterine Polyp  The various methods of treatment have been discussed with the patient and family. After consideration of risks, benefits and other options for treatment, the patient has consented to  Procedure(s) with comments: DILATATION & CURETTAGE/HYSTEROSCOPY WITH MYOSURE (N/A) - Polypectomy as a surgical intervention .  The patient's history has been reviewed, patient examined, no change in status, stable for surgery.  I have reviewed the patient's chart and labs.  Questions were answered to the patient's satisfaction.     Myna HidalgoZAN, Samuell Knoble, M

## 2015-05-31 ENCOUNTER — Encounter (HOSPITAL_COMMUNITY): Payer: Self-pay | Admitting: Obstetrics & Gynecology

## 2016-09-23 ENCOUNTER — Other Ambulatory Visit (HOSPITAL_COMMUNITY)
Admission: RE | Admit: 2016-09-23 | Discharge: 2016-09-23 | Disposition: A | Discharge status: Home / Self Care | Payer: BC Managed Care – PPO | Source: Ambulatory Visit | Attending: Family Medicine | Admitting: Family Medicine

## 2016-09-23 ENCOUNTER — Other Ambulatory Visit: Payer: Self-pay | Admitting: Family Medicine

## 2016-09-23 DIAGNOSIS — Z124 Encounter for screening for malignant neoplasm of cervix: Secondary | ICD-10-CM | POA: Diagnosis present

## 2016-09-25 LAB — CYTOLOGY - PAP: Diagnosis: NEGATIVE

## 2017-04-11 ENCOUNTER — Emergency Department (HOSPITAL_COMMUNITY)
Admission: EM | Admit: 2017-04-11 | Discharge: 2017-04-11 | Disposition: A | Discharge status: Home / Self Care | Payer: BC Managed Care – PPO | Attending: Emergency Medicine | Admitting: Emergency Medicine

## 2017-04-11 ENCOUNTER — Emergency Department (HOSPITAL_COMMUNITY): Payer: BC Managed Care – PPO

## 2017-04-11 ENCOUNTER — Other Ambulatory Visit: Payer: Self-pay

## 2017-04-11 ENCOUNTER — Encounter (HOSPITAL_COMMUNITY): Payer: Self-pay | Admitting: *Deleted

## 2017-04-11 DIAGNOSIS — R5383 Other fatigue: Secondary | ICD-10-CM | POA: Diagnosis present

## 2017-04-11 DIAGNOSIS — R002 Palpitations: Secondary | ICD-10-CM | POA: Insufficient documentation

## 2017-04-11 DIAGNOSIS — R42 Dizziness and giddiness: Secondary | ICD-10-CM | POA: Insufficient documentation

## 2017-04-11 DIAGNOSIS — R202 Paresthesia of skin: Secondary | ICD-10-CM | POA: Diagnosis not present

## 2017-04-11 DIAGNOSIS — R0789 Other chest pain: Secondary | ICD-10-CM | POA: Diagnosis not present

## 2017-04-11 LAB — I-STAT TROPONIN, ED
TROPONIN I, POC: 0 ng/mL (ref 0.00–0.08)
TROPONIN I, POC: 0 ng/mL (ref 0.00–0.08)

## 2017-04-11 LAB — URINALYSIS, ROUTINE W REFLEX MICROSCOPIC
Bilirubin Urine: NEGATIVE
Glucose, UA: NEGATIVE mg/dL
HGB URINE DIPSTICK: NEGATIVE
Ketones, ur: NEGATIVE mg/dL
LEUKOCYTES UA: NEGATIVE
NITRITE: NEGATIVE
PROTEIN: NEGATIVE mg/dL
Specific Gravity, Urine: 1.011 (ref 1.005–1.030)
pH: 8 (ref 5.0–8.0)

## 2017-04-11 LAB — I-STAT BETA HCG BLOOD, ED (MC, WL, AP ONLY)

## 2017-04-11 LAB — BASIC METABOLIC PANEL
Anion gap: 11 (ref 5–15)
BUN: 19 mg/dL (ref 6–20)
CALCIUM: 9.2 mg/dL (ref 8.9–10.3)
CO2: 23 mmol/L (ref 22–32)
CREATININE: 0.86 mg/dL (ref 0.44–1.00)
Chloride: 105 mmol/L (ref 101–111)
Glucose, Bld: 96 mg/dL (ref 65–99)
Potassium: 3.9 mmol/L (ref 3.5–5.1)
SODIUM: 139 mmol/L (ref 135–145)

## 2017-04-11 LAB — CBC
HCT: 41.9 % (ref 36.0–46.0)
Hemoglobin: 13.9 g/dL (ref 12.0–15.0)
MCH: 31.7 pg (ref 26.0–34.0)
MCHC: 33.2 g/dL (ref 30.0–36.0)
MCV: 95.4 fL (ref 78.0–100.0)
PLATELETS: 247 10*3/uL (ref 150–400)
RBC: 4.39 MIL/uL (ref 3.87–5.11)
RDW: 13.7 % (ref 11.5–15.5)
WBC: 9.1 10*3/uL (ref 4.0–10.5)

## 2017-04-11 LAB — T4, FREE: Free T4: 1.02 ng/dL (ref 0.61–1.12)

## 2017-04-11 LAB — MAGNESIUM: MAGNESIUM: 2.1 mg/dL (ref 1.7–2.4)

## 2017-04-11 LAB — TSH: TSH: 2.435 u[IU]/mL (ref 0.350–4.500)

## 2017-04-11 NOTE — ED Triage Notes (Signed)
Pt has been feeling generally weak and not feeling well. Hx of afib during menopause. Reports being at home tonight and felling intermittent "skips" in her heart. Denies chest pain, endorses mild shob

## 2017-04-11 NOTE — ED Provider Notes (Signed)
[facility] EMERGENCY DEPARTMENT Provider Note   CSN: [id number] Arrival date & time: 04/11/17  0038     History   Chief Complaint Chief Complaint  Patient presents with  . Fatigue  . Atrial Fibrillation    HPI Kelli Stephens is a 52 y.o. female.  HPI Patient states she developed thoracic back pain 1 week ago.  Was seen in urgent care and referred to her primary physician for follow-up.  Has been unable to see her primary physician.  States she was feeling well yesterday and then last night developed irregular palpitations.  Also described lightheadedness, generalized fatigue and occasional tingling in extremities.  She states she had some central chest tightness but denies any pain.  No fever or chills.  No lower extremity swelling or tenderness.  No recent extended travel or immobilization.  Denies any visual changes. Past Medical History:  Diagnosis Date  . Arthritis    hips/knees  . Ectopic pregnancy   . Gallstones   . Myasthenia gravis (HCC)   . PVC (premature ventricular contraction)    stress related, nornal EKG 02/2015  . SVD (spontaneous vaginal delivery)    x 2    Patient Active Problem List   Diagnosis Date Noted  . Symptomatic cholelithiasis 03/22/2012    Past Surgical History:  Procedure Laterality Date  . CESAREAN SECTION     X 1  . CHOLECYSTECTOMY N/A 03/22/2012   Procedure: LAPAROSCOPIC CHOLECYSTECTOMY;  Surgeon: Cherylynn Ridges, MD;  Location: [facility] OR;  Service: General;  Laterality: N/A;  . DILATATION & CURETTAGE/HYSTEROSCOPY WITH MYOSURE N/A 05/30/2015   Procedure: DILATATION & CURETTAGE/HYSTEROSCOPY WITH MYOSURE;  Surgeon: Myna Hidalgo, DO;  Location: [facility] ORS;  Service: Gynecology;  Laterality: N/A;  Polypectomy  . OOPHORECTOMY Left    Open  . TUBAL LIGATION      OB History    No data available       Home Medications    Prior to Admission medications   Medication Sig Start Date End Date Taking? Authorizing Provider    cholecalciferol (VITAMIN D) 1000 units tablet Take 1,000 Units by mouth daily.    [provider]  Cranberry 200 MG CAPS Take 200 mg by mouth daily.    [provider]  Multiple Vitamin (MULTIVITAMIN WITH MINERALS) TABS tablet Take 1 tablet by mouth daily.    [provider]  Omega-3 Fatty Acids (FISH OIL) 1000 MG CAPS Take 1,000 mg by mouth daily.    [provider]    Family History Family History  Problem Relation Age of Onset  . Cancer Mother   . Hypertension Mother   . Hypertension Father   . Arthritis Father   . Cancer Sister   . Heart disease Sister   . Crohn's disease Sister   . Down syndrome Brother   . Arthritis Sister   . Heart disease Sister   . Psoriasis Sister     Social History Social History   Tobacco Use  . Smoking status: Never Smoker  . Smokeless tobacco: Never Used  Substance Use Topics  . Alcohol use: Yes    Comment: 1 glass of wine a month   . Drug use: No     Allergies   Patient has no known allergies.   Review of Systems Review of Systems  Constitutional: Positive for fatigue. Negative for chills and fever.  HENT: Negative for congestion, sneezing, sore throat and trouble swallowing.   Eyes: Negative for photophobia and visual disturbance.  Respiratory: Positive for chest tightness and shortness of breath. Negative for cough.   Cardiovascular: Positive for palpitations. Negative for chest pain and leg swelling.  Gastrointestinal: Negative for abdominal pain, constipation, diarrhea, nausea and vomiting.  Genitourinary: Positive for frequency. Negative for difficulty urinating, dysuria, flank pain and pelvic pain.  Musculoskeletal: Positive for back pain. Negative for arthralgias, neck pain and neck stiffness.  Skin: Negative for rash and wound.  Neurological: Positive for dizziness, light-headedness, numbness and headaches. Negative for tremors, syncope and weakness.  Psychiatric/Behavioral: Positive for  sleep disturbance.  All other systems reviewed and are negative.    Physical Exam Updated Vital Signs BP 108/73   Pulse 66   Temp 98.2 F (36.8 C) (Oral)   Resp 18   SpO2 100%   Physical Exam  Constitutional: She is oriented to person, place, and time. She appears well-developed and well-nourished. No distress.  HENT:  Head: Normocephalic and atraumatic.  Mouth/Throat: Oropharynx is clear and moist. No oropharyngeal exudate.  No sinus tenderness to percussion.  Eyes: EOM are normal. Pupils are equal, round, and reactive to light.  Neck: Normal range of motion. Neck supple. No thyromegaly present.  No meningismus  Cardiovascular: Normal rate and regular rhythm. Exam reveals no gallop and no friction rub.  No murmur heard. Pulmonary/Chest: Effort normal and breath sounds normal. No stridor. No respiratory distress. She has no wheezes. She has no rales. She exhibits no tenderness.  Abdominal: Soft. Bowel sounds are normal. There is no tenderness. There is no rebound and no guarding.  Musculoskeletal: Normal range of motion. She exhibits no edema or tenderness.  No lower extremity swelling, asymmetry or tenderness.  2+ distal pulses.  No midline thoracic or lumbar tenderness.  No CVA tenderness.  Lymphadenopathy:    She has no cervical adenopathy.  Neurological: She is alert and oriented to person, place, and time.  Patient is alert and oriented x3 with clear, goal oriented speech. Patient has 5/5 motor in all extremities. Sensation is intact to light touch. Bilateral finger-to-nose is normal with no signs of dysmetria. Patient has a normal gait and walks without assistance.  2+ patellar DTRs bilaterally.  Skin: Skin is warm and dry. Capillary refill takes less than 2 seconds. No rash noted. She is not diaphoretic. No erythema.  Psychiatric: She has a normal mood and affect. Her behavior is normal.  Nursing note and vitals reviewed.    ED Treatments / Results  Labs (all labs  ordered are listed, but only abnormal results are displayed) Labs Reviewed  URINALYSIS, ROUTINE W REFLEX MICROSCOPIC - Abnormal; Notable for the following components:      Result Value   APPearance HAZY (*)    All other components within normal limits  BASIC METABOLIC PANEL  CBC  MAGNESIUM  TSH  T4, FREE  I-STAT TROPONIN, ED  I-STAT BETA HCG BLOOD, ED ([facility], [facility], [facility] ONLY)  I-STAT TROPONIN, ED    EKG  EKG Interpretation  Date/Time:  Sunday April 11 2017 00:47:42 EDT Ventricular Rate:  76 PR Interval:  164 QRS Duration: 86 QT Interval:  402 QTC Calculation: 452 R Axis:   71 Text Interpretation:  Normal sinus rhythm Normal ECG Normal sinus rhythm Confirmed by Corlis LeakMackuen, Courteney ([id number]) on 04/11/2017 8:08:33 AM       Radiology Dg Chest 2 View  Result Date: 04/11/2017 CLINICAL DATA:  Shortness of breath.  Weakness. EXAM: CHEST - 2 VIEW COMPARISON:  Radiographs and CT 03/24/2015 FINDINGS: The cardiomediastinal contours are normal. Pulmonary vasculature  is normal. No consolidation, pleural effusion, or pneumothorax. No acute osseous abnormalities are seen. EKG leads overlie the chest. IMPRESSION: No acute pulmonary process. Electronically Signed   By: Rubye Oaks M.D.   On: 04/11/2017 02:22    Procedures Procedures (including critical care time)  Medications Ordered in ED Medications - No data to display   Initial Impression / Assessment and Plan / ED Course  I have reviewed the triage vital signs and the nursing notes.  Pertinent labs & imaging results that were available during my care of the patient were reviewed by me and considered in my medical decision making (see chart for details).    Laboratory workup is normal.  EKG with normal sinus rhythm.  Patient is not orthostatic.  Observed on the monitor in the emergency department.  No dysrhythmias.  Patient says she is feeling better.  Continues to have a normal neurologic exam.  Advised follow-up with cardiology and  with her primary physician.  Return precautions have been given.   Final Clinical Impressions(s) / ED Diagnoses   Final diagnoses:  Fatigue, unspecified type  Intermittent palpitations    ED Discharge Orders    None       Loren Racer, MD 04/11/17 1249

## 2017-10-07 ENCOUNTER — Other Ambulatory Visit: Payer: Self-pay | Admitting: Family Medicine

## 2017-10-07 DIAGNOSIS — R51 Headache: Principal | ICD-10-CM

## 2017-10-07 DIAGNOSIS — R519 Headache, unspecified: Secondary | ICD-10-CM

## 2017-10-08 ENCOUNTER — Ambulatory Visit
Admission: RE | Admit: 2017-10-08 | Discharge: 2017-10-08 | Disposition: A | Discharge status: Home / Self Care | Payer: BC Managed Care – PPO | Source: Ambulatory Visit | Attending: Family Medicine | Admitting: Family Medicine

## 2017-10-08 DIAGNOSIS — R51 Headache: Principal | ICD-10-CM

## 2017-10-08 DIAGNOSIS — R519 Headache, unspecified: Secondary | ICD-10-CM

## 2017-10-08 MED ORDER — GADOBENATE DIMEGLUMINE 529 MG/ML IV SOLN
15.0000 mL | Freq: Once | INTRAVENOUS | Status: AC | PRN
  Administered 2017-10-08: 15 mL via INTRAVENOUS

## 2018-03-10 ENCOUNTER — Encounter (HOSPITAL_COMMUNITY): Payer: Self-pay

## 2018-03-10 ENCOUNTER — Emergency Department (HOSPITAL_COMMUNITY): Payer: BC Managed Care – PPO

## 2018-03-10 ENCOUNTER — Emergency Department (HOSPITAL_COMMUNITY)
Admission: EM | Admit: 2018-03-10 | Discharge: 2018-03-11 | Disposition: A | Discharge status: Home / Self Care | Payer: BC Managed Care – PPO | Attending: Emergency Medicine | Admitting: Emergency Medicine

## 2018-03-10 ENCOUNTER — Other Ambulatory Visit: Payer: Self-pay

## 2018-03-10 DIAGNOSIS — R0789 Other chest pain: Secondary | ICD-10-CM | POA: Diagnosis present

## 2018-03-10 DIAGNOSIS — R002 Palpitations: Secondary | ICD-10-CM | POA: Insufficient documentation

## 2018-03-10 DIAGNOSIS — Z79899 Other long term (current) drug therapy: Secondary | ICD-10-CM | POA: Diagnosis not present

## 2018-03-10 LAB — I-STAT BETA HCG BLOOD, ED (MC, WL, AP ONLY): I-stat hCG, quantitative: <5 m[IU]/mL (ref <5)

## 2018-03-10 LAB — CBC
HCT: 42.8 % (ref 36.0–46.0)
Hemoglobin: 13.6 g/dL (ref 12.0–15.0)
MCH: 30.6 pg (ref 26.0–34.0)
MCHC: 31.8 g/dL (ref 30.0–36.0)
MCV: 96.4 fL (ref 80.0–100.0)
Platelets: 255 10*3/uL (ref 150–400)
RBC: 4.44 MIL/uL (ref 3.87–5.11)
RDW: 13.2 % (ref 11.5–15.5)
WBC: 7.5 10*3/uL (ref 4.0–10.5)
nRBC: 0 % (ref 0.0–0.2)

## 2018-03-10 LAB — I-STAT TROPONIN, ED: Troponin i, poc: 0.02 ng/mL (ref 0.00–0.08)

## 2018-03-10 LAB — BASIC METABOLIC PANEL
Anion gap: 13 (ref 5–15)
BUN: 13 mg/dL (ref 6–20)
CHLORIDE: 103 mmol/L (ref 98–111)
CO2: 23 mmol/L (ref 22–32)
Calcium: 9.7 mg/dL (ref 8.9–10.3)
Creatinine, Ser: 0.96 mg/dL (ref 0.44–1.00)
GFR calc Af Amer: >60 mL/min (ref >60)
GFR calc non Af Amer: >60 mL/min (ref >60)
GLUCOSE: 97 mg/dL (ref 70–99)
POTASSIUM: 4.1 mmol/L (ref 3.5–5.1)
Sodium: 139 mmol/L (ref 135–145)

## 2018-03-10 MED ORDER — SODIUM CHLORIDE 0.9% FLUSH: 3.0000 mL | Freq: Once | INTRAVENOUS | Status: DC

## 2018-03-10 NOTE — ED Triage Notes (Signed)
Pt arrives POV for eval of palpitations w/ associated lightheadedness and chest tightness. Pt denies those sx on arrival, states they come for 10-20 seconds and then resolve. Pt does have hx of afib which she experienced after menopause. NARD, NAD

## 2018-03-11 NOTE — ED Provider Notes (Signed)
[facility] EMERGENCY DEPARTMENT Provider Note   CSN: [id number] Arrival date & time: 03/10/18  1959     History   Chief Complaint Chief Complaint  Patient presents with  . Chest Pain    HPI Kelli Stephens is a 53 y.o. female.  The history is provided by the patient.  Palpitations  Onset quality:  Gradual Duration:  1 minute Timing:  Intermittent Progression:  Resolved Chronicity:  New Context: not anxiety, not caffeine, not illicit drugs, not nicotine and not stimulant use   Relieved by:  None tried Worsened by:  Nothing Associated symptoms: shortness of breath   Associated symptoms: no cough, no diaphoresis, no lower extremity edema, no syncope and no vomiting   Associated symptoms comment:  "Chest squeezing " Risk factors: no heart disease, no hx of DVT, no hx of PE, no hyperthyroidism and no OTC sinus medications   Patient reports palpitations for the past 2 weeks.  She reports the episodes comes at random and it feels that her heart is racing.  It lasts less than 1 minute.  No syncope.  She reports that time she has chest squeezing but denies any chest pain.  No pleuritic pain.  No cough.  No fevers. No history of VTE/CAD She reports distant history of A. fib that resolved after menopause.  She never required medications or cardioversion She takes no meds Past Medical History:  Diagnosis Date  . Arthritis    hips/knees  . Ectopic pregnancy   . Gallstones   . Myasthenia gravis (HCC)   . PVC (premature ventricular contraction)    stress related, nornal EKG 02/2015  . SVD (spontaneous vaginal delivery)    x 2    Patient Active Problem List   Diagnosis Date Noted  . Symptomatic cholelithiasis 03/22/2012    Past Surgical History:  Procedure Laterality Date  . CESAREAN SECTION     X 1  . CHOLECYSTECTOMY N/A 03/22/2012   Procedure: LAPAROSCOPIC CHOLECYSTECTOMY;  Surgeon: Cherylynn Ridges, MD;  Location: [facility] OR;  Service: General;  Laterality: N/A;    . DILATATION & CURETTAGE/HYSTEROSCOPY WITH MYOSURE N/A 05/30/2015   Procedure: DILATATION & CURETTAGE/HYSTEROSCOPY WITH MYOSURE;  Surgeon: Myna Hidalgo, DO;  Location: [facility] ORS;  Service: Gynecology;  Laterality: N/A;  Polypectomy  . OOPHORECTOMY Left    Open  . TUBAL LIGATION       OB History   No obstetric history on file.      Home Medications    Prior to Admission medications   Medication Sig Start Date End Date Taking? Authorizing Provider  cholecalciferol (VITAMIN D) 1000 units tablet Take 1,000 Units by mouth daily.    [provider]  Cranberry 200 MG CAPS Take 200 mg by mouth daily.    [provider]  Multiple Vitamin (MULTIVITAMIN WITH MINERALS) TABS tablet Take 1 tablet by mouth daily.    [provider]  Omega-3 Fatty Acids (FISH OIL) 1000 MG CAPS Take 1,000 mg by mouth daily.    [provider]    Family History Family History  Problem Relation Age of Onset  . Cancer Mother   . Hypertension Mother   . Hypertension Father   . Arthritis Father   . Cancer Sister   . Heart disease Sister   . Crohn's disease Sister   . Down syndrome Brother   . Arthritis Sister   . Heart disease Sister   . Psoriasis Sister     Social History Social History  Tobacco Use  . Smoking status: Never Smoker  . Smokeless tobacco: Never Used  Substance Use Topics  . Alcohol use: Yes    Comment: 1 glass of wine a month   . Drug use: No     Allergies   Patient has no known allergies.   Review of Systems Review of Systems  Constitutional: Negative for diaphoresis.  Respiratory: Positive for shortness of breath. Negative for cough.   Cardiovascular: Positive for palpitations. Negative for leg swelling and syncope.  Gastrointestinal: Negative for vomiting.  Neurological: Positive for light-headedness. Negative for syncope.  All other systems reviewed and are negative.    Physical Exam Updated Vital Signs BP 130/69 (BP Location: Left  Arm)   Pulse 70   Temp 98.2 F (36.8 C) (Oral)   Resp 17   Ht 1.626 m (5\' 4" )   Wt 80.7 kg   SpO2 97%   BMI 30.55 kg/m   Physical Exam CONSTITUTIONAL: Well developed/well nourished HEAD: Normocephalic/atraumatic EYES: EOMI/PERRL ENMT: Mucous membranes moist NECK: supple no meningeal signs SPINE/BACK:entire spine nontender CV: S1/S2 noted, no murmurs/rubs/gallops noted LUNGS: Lungs are clear to auscultation bilaterally, no apparent distress ABDOMEN: soft, nontender, no rebound or guarding, bowel sounds noted throughout abdomen GU:no cva tenderness NEURO: Pt is awake/alert/appropriate, moves all extremitiesx4.  No facial droop.   EXTREMITIES: pulses normal/equal, full ROM, no calf tenderness or edema SKIN: warm, color normal PSYCH: no abnormalities of mood noted, alert and oriented to situation   ED Treatments / Results  Labs (all labs ordered are listed, but only abnormal results are displayed) Labs Reviewed  BASIC METABOLIC PANEL  CBC  I-STAT BETA HCG BLOOD, ED ([facility], [facility], [facility] ONLY)  I-STAT TROPONIN, ED    EKG EKG Interpretation  Date/Time:  Thursday March 10 2018 20:11:26 EST Ventricular Rate:  73 PR Interval:  162 QRS Duration: 82 QT Interval:  370 QTC Calculation: 407 R Axis:   70 Text Interpretation:  Normal sinus rhythm with sinus arrhythmia Nonspecific ST abnormality Abnormal ECG No significant change since last tracing Confirmed by Zadie RhineWickline, Ece Cumberland ([id number]) on 03/11/2018 1:24:51 AM   Radiology Dg Chest 2 View  Result Date: 03/10/2018 CLINICAL DATA:  Chest pain EXAM: CHEST - 2 VIEW COMPARISON:  04/11/2017 FINDINGS: The heart size and mediastinal contours are within normal limits. Both lungs are clear. The visualized skeletal structures are unremarkable. IMPRESSION: No active cardiopulmonary disease. Electronically Signed   By: Alcide CleverMark  Lukens M.D.   On: 03/10/2018 21:04    Procedures Procedures  Medications Ordered in ED Medications  sodium chloride flush  (NS) 0.9 % injection 3 mL (has no administration in time range)     Initial Impression / Assessment and Plan / ED Course  I have reviewed the triage vital signs and the nursing notes.  Pertinent labs & imaging results that were available during my care of the patient were reviewed by me and considered in my medical decision making (see chart for details).     Patient presents for palpitations.  She is symptom-free at this time.  I reviewed the telemetry monitoring while she has been in the room and there has been no dysrhythmia.  EKG when compared to prior is unchanged. She feels at baseline.  She does not take excessive caffeine or drugs.  She does not appear to have any signs of hyperthyroid She will follow-up as an outpatient with her PCP for Holter monitoring.  She reports distant history of A. fib, but reports it resolved.  No signs  of A. fib tonight  Discussed strict return precautions Final Clinical Impressions(s) / ED Diagnoses   Final diagnoses:  Palpitations    ED Discharge Orders    None       Zadie Rhine, MD 03/11/18 [phone]

## 2018-03-11 NOTE — ED Notes (Signed)
E-signature not available, pt verbalized understanding of DC instructions
